# Patient Record
Sex: Female | Born: 1960 | Hispanic: Yes | Marital: Married | Smoking: Never smoker
Health system: Southern US, Community
[De-identification: ages and names within clinical notes are randomized; demographics above are authoritative.]

## PROBLEM LIST (undated history)

## (undated) DIAGNOSIS — R319 Hematuria, unspecified: Secondary | ICD-10-CM

## (undated) HISTORY — DX: Hematuria, unspecified: R31.9

---

## 2014-03-09 DIAGNOSIS — R319 Hematuria, unspecified: Secondary | ICD-10-CM | POA: Insufficient documentation

## 2014-03-09 DIAGNOSIS — R351 Nocturia: Secondary | ICD-10-CM | POA: Insufficient documentation

## 2014-03-09 DIAGNOSIS — R7303 Prediabetes: Secondary | ICD-10-CM | POA: Insufficient documentation

## 2014-03-09 DIAGNOSIS — R809 Proteinuria, unspecified: Secondary | ICD-10-CM | POA: Insufficient documentation

## 2014-12-26 ENCOUNTER — Ambulatory Visit: Payer: Self-pay | Admitting: Obstetrics and Gynecology

## 2014-12-26 ENCOUNTER — Encounter: Payer: Self-pay | Admitting: *Deleted

## 2015-07-31 ENCOUNTER — Encounter: Payer: Self-pay | Admitting: *Deleted

## 2015-08-03 ENCOUNTER — Encounter: Admission: RE | Payer: Self-pay | Source: Ambulatory Visit

## 2015-08-03 ENCOUNTER — Ambulatory Visit: Admission: RE | Admit: 2015-08-03 | Payer: Medicaid Other | Source: Ambulatory Visit | Admitting: Gastroenterology

## 2015-08-03 SURGERY — COLONOSCOPY WITH PROPOFOL
Anesthesia: General

## 2015-08-03 NOTE — H&P (Signed)
    Primary Care Physician:  No primary care provider on file. Primary Gastroenterologist:  Dr. Bluford Kaufmannh  Pre-Procedure History & Physical: HPI:  Kimberly Townsend is a 55 y.o. female is here for an colonoscopy   History reviewed. No pertinent past medical history.  Past Surgical History  Procedure Laterality Date  . Cesarean section      Prior to Admission medications   Not on File    Allergies as of 07/23/2015  . (Not on File)    History reviewed. No pertinent family history.  Social History   Social History  . Marital Status: Married    Spouse Name: N/A  . Number of Children: N/A  . Years of Education: N/A   Occupational History  . Not on file.   Social History Main Topics  . Smoking status: Never Smoker   . Smokeless tobacco: Not on file  . Alcohol Use: No  . Drug Use: No  . Sexual Activity: Not on file   Other Topics Concern  . Not on file   Social History Narrative  . No narrative on file    Review of Systems: See HPI, otherwise negative ROS  Physical Exam: There were no vitals taken for this visit. General:   Alert,  pleasant and cooperative in NAD Head:  Normocephalic and atraumatic. Neck:  Supple; no masses or thyromegaly. Lungs:  Clear throughout to auscultation.    Heart:  Regular rate and rhythm. Abdomen:  Soft, nontender and nondistended. Normal bowel sounds, without guarding, and without rebound.   Neurologic:  Alert and  oriented x4;  grossly normal neurologically.  Impression/Plan: Kimberly Townsend is here for an colonoscopy to be performed for screening Risks, benefits, limitations, and alternatives regarding  colonoscopy have been reviewed with the patient.  Questions have been answered.  All parties agreeable.   Ikaika Showers, Ezzard StandingPAUL Y, MD  08/03/2015, 11:38 AM

## 2015-08-07 ENCOUNTER — Encounter: Payer: Self-pay | Admitting: *Deleted

## 2015-08-12 ENCOUNTER — Ambulatory Visit: Payer: Medicaid Other | Admitting: Urology

## 2015-08-25 ENCOUNTER — Ambulatory Visit: Payer: Medicaid Other | Admitting: Urology

## 2015-09-04 ENCOUNTER — Ambulatory Visit: Payer: Medicaid Other | Admitting: Urology

## 2015-09-18 ENCOUNTER — Ambulatory Visit (INDEPENDENT_AMBULATORY_CARE_PROVIDER_SITE_OTHER): Payer: Medicaid Other | Admitting: Urology

## 2015-09-18 ENCOUNTER — Encounter: Payer: Self-pay | Admitting: Urology

## 2015-09-18 VITALS — BP 90/45 | HR 80 | Ht 63.0 in

## 2015-09-18 DIAGNOSIS — R3129 Other microscopic hematuria: Secondary | ICD-10-CM | POA: Diagnosis not present

## 2015-09-18 LAB — URINALYSIS, COMPLETE
Bilirubin, UA: NEGATIVE
GLUCOSE, UA: NEGATIVE
Ketones, UA: NEGATIVE
NITRITE UA: NEGATIVE
Protein, UA: NEGATIVE
SPEC GRAV UA: 1.025 (ref 1.005–1.030)
UUROB: 0.2 mg/dL (ref 0.2–1.0)
pH, UA: 5.5 (ref 5.0–7.5)

## 2015-09-18 LAB — MICROSCOPIC EXAMINATION

## 2015-09-18 NOTE — Progress Notes (Signed)
09/18/2015 10:54 AM   Kimberly Townsend 08-20-1960 [id number]  Referring provider: No referring provider defined for this encounter.  Chief Complaint  Patient presents with  . Hematuria    New Patient    HPI: Patient is a 55 -year-old [name] who presents today as a referral from their PCP, Dr. [name], for microscopic hematuria.  Patient was found to have a positive UA dip for blood on 04/29/2015.   Patient  does have a prior history of microscopic hematuria.    She does not have a prior history of recurrent urinary tract infections, nephrolithiasis, trauma to the genitourinary tract or malignancies of the genitourinary tract.   She does not have a family medical history of nephrolithiasis, malignancies of the genitourinary tract or hematuria.   Today, she is not having symptoms of frequent urination, urgency, dysuria, nocturia, incontinence, hesitancy, intermittency, straining to urinate or a weak urinary stream.  Her UA today demonstrates 11-30 RBC's/hpf.  She is not experiencing any suprapubic pain, abdominal pain or flank pain. She denies any recent fevers, chills, nausea or vomiting.   [facility].  He was found to have a punctate calculus in the left renal superior pole. 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    She is not a smoker.  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  She is not exposed to industrial chemicals.    PMH: Past Medical History  Diagnosis Date  . Hematuria     Surgical History: Past Surgical History  Procedure Laterality Date  . Cesarean section      Home Medications:    Medication List    Notice  As of 09/18/2015 10:54 AM   You have not been prescribed any medications.      Allergies: No Known Allergies  Family History: Family History  Problem Relation  Age of Onset  . Prostate cancer Neg Hx   . Kidney disease Neg Hx   . Kidney cancer Neg Hx     Social History:  reports that she has never smoked. She does not have any smokeless tobacco history on file. She reports that she does not drink alcohol or use illicit drugs.  ROS: UROLOGY Frequent Urination?: Yes Hard to postpone urination?: Yes Burning/pain with urination?: No Get up at night to urinate?: Yes Leakage of urine?: No Urine stream starts and stops?: No Trouble starting stream?: No Do you have to strain to urinate?: No Blood in urine?: No Urinary tract infection?: No Sexually transmitted disease?: No Injury to kidneys or bladder?: No Painful intercourse?: No Weak stream?: No Currently pregnant?: No Vaginal bleeding?: No Last menstrual period?: 03/2015  Gastrointestinal Nausea?: No Vomiting?: No Indigestion/heartburn?: No Diarrhea?: No Constipation?: No  Constitutional Fever: No Night sweats?: No Weight loss?: No Fatigue?: No  Skin Skin rash/lesions?: No Itching?: No  Eyes Blurred vision?: No Double vision?: No  Ears/Nose/Throat Sore throat?: No Sinus problems?: Yes  Hematologic/Lymphatic Swollen glands?: No Easy bruising?: No  Cardiovascular Leg swelling?: No Chest pain?: No  Respiratory Cough?: No Shortness of breath?: No  Endocrine Excessive thirst?: No  Musculoskeletal Back pain?: No Joint pain?: No  Neurological Headaches?: No Dizziness?: No  Psychologic Depression?: No Anxiety?: No  Physical Exam: BP 90/45 mmHg  Pulse 80  Ht 5\' 3"  (1.6 m)  LMP 04/20/2015 (Approximate)  Constitutional: Well nourished. Alert and oriented, No acute distress. HEENT: [place] AT, moist mucus membranes. Trachea midline, no masses. Cardiovascular: No clubbing, cyanosis, or edema. Respiratory: Normal respiratory effort, no increased work of breathing. GI: Abdomen is soft, non tender, non distended, no abdominal masses. Liver and spleen not  palpable.  No hernias appreciated.  Stool sample for occult testing is not indicated.   GU: No CVA tenderness.  No bladder fullness or masses.  Normal external genitalia, normal pubic hair distribution, no lesions.  Normal urethral meatus, no lesions, no prolapse, no discharge.   No urethral masses, tenderness and/or tenderness. No bladder fullness, tenderness or masses. Normal vagina mucosa, good estrogen effect, no discharge, no lesions, good pelvic support, no cystocele or rectocele noted.  No cervical motion tenderness.  Uterus is freely mobile and non-fixed.  No adnexal/parametria masses or tenderness noted.  Anus and perineum are without rashes or lesions.    Skin: No rashes, bruises or suspicious lesions. Lymph: No cervical or inguinal adenopathy. Neurologic: Grossly intact, no focal deficits, moving all 4 extremities. Psychiatric: Normal mood and affect.  Laboratory Data: Urinalysis Results for orders placed or performed in visit on 09/18/15  Microscopic Examination  Result Value Ref Range   WBC, UA >30 (A) 0 -  5 /hpf   RBC, UA 11-30 (A) 0 -  2 /hpf   Epithelial Cells (non renal) 0-10 0 - 10 /hpf   Renal Epithel, UA 0-10 (A) None seen /hpf   Bacteria, UA Few None seen/Few  Urinalysis, Complete  Result Value Ref Range   Specific Gravity, UA 1.025 1.005 - 1.030   pH, UA 5.5 5.0 - 7.5   Color, UA Yellow Yellow   Appearance Ur Cloudy (A) Clear   Leukocytes, UA 2+ (A) Negative   Protein, UA Negative Negative/Trace   Glucose, UA Negative Negative   Ketones, UA Negative Negative   RBC, UA 2+ (A) Negative   Bilirubin, UA Negative Negative   Urobilinogen, Ur 0.2 0.2 - 1.0 mg/dL   Nitrite, UA Negative Negative   Microscopic Examination See below:   BUN+Creat  Result Value Ref Range   BUN 14 6 - 24 mg/dL   Creatinine, Ser 1.610.78 0.57 - 1.00 mg/dL   GFR calc non Af Amer 85 >59 mL/min/1.73   GFR calc Af Amer 98 >59 mL/min/1.73   BUN/Creatinine Ratio 18 9 - 23   Pertinent  Imaging: CT abdomen and pelvis, without and with IV contrast, 11/13/2014  Comparison:  None.  Indication:  R31.2 Other microscopic hematuria, microhematuria.  Technique:  CT imaging was performed of the abdomen and pelvis without and with IV contrast. The patient received intravenous contrast (Isovue-300, 150 mL at 3 mL/sec) without incident.  A genitourinary protocol CT was performed, with noncontrast imaging from the kidneys through the bladder, nephrographic phase imaging through the kidneys, and excretory phase imaging from the kidneys through the bladder.   Iodinated contrast was used due to the indications for the examination, to improve disease detection and to further define anatomy. This examination was interpreted using coronal reformatted images to optimize visualization of the collecting system and ureters. The most recent serum creatinine is 0.7 mg/dL on 0/96/04548/25/2016.    Findings:  The imaged heart and lung bases are unremarkable.  Punctate calculus in the left renal superior pole (series 2 image 15) is nonspecific. No hydronephrosis. 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 The ureters are nondilated bilaterally. The left and right ureters are patent. No filling defects are seen within the urinary bladder. The urinary bladder is normal.  The liver is normal in size and contour. A rounded 1.2 cm low-attenuation lesion in the left hepatic lobe (series 3 image 46) measures fluid attenuation and is favored to represent a hepatic cyst. No biliary ductal dilatation. The hepatic and portal veins are patent. The gallbladder is normal. The spleen, pancreas, and adrenal glands are normal. The small and large bowel are normal in caliber. No free intraperitoneal air. No free fluid. No mesenteric, retroperitoneal, or pelvic sidewall lymphadenopathy.  Sclerotic changes are  seen at the pubic symphysis and the bilateral SI joints. No acute or aggressive osseous lesions.  Impression: 1. Punctate renal calculus in the left superior renal pole. No hydronephrosis. No hydroureter. No masses are identified in the collecting system. 2. 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  The preliminary report (critical or emergent communication) was reviewed prior to this dictation and there are no substantial differences between the preliminary results and the impressions in this final report.  Electronically Reviewed by:  Lyla Glassing, MD Electronically Reviewed on:  11/14/2014 10:37 AM  I have reviewed the images and concur with the above findings.  Electronically Signed by:  Manfred Shirts MD Electronically Signed on:  11/14/2014 12:36 PM   Assessment & Plan:    1. Microscopic hematuria:   Patient underwent a CT urogram in August 2016 at [facility]. No GU malignancies were discovered.   I explained to the patient that there are a number of causes that can be associated with blood in the urine, such as stones, UTI's, damage to the urinary tract and/or cancer.   I've recommended a cystoscopy to complete her hematuria work up. I described how this is performed, typically in an office setting with a flexible cystoscope. We described the risks, benefits, and possible side effects, the most common of which is a minor amount of blood in the urine and/or burning which usually resolves in 24 to 48 hours.    The patient had the opportunity to ask questions which were answered. Based upon this discussion, the patient is willing to proceed. Therefore, I've ordered: a cystoscopy.  She will return following all of the above for discussion of the results.     - Urinalysis, Complete - CULTURE, URINE COMPREHENSIVE - BUN+Creat   Return for cystoscopy .  These notes  generated with voice recognition software. I apologize for typographical errors.  Michiel Cowboy, PA-C  [facility] [address] [place], [place] [address] [phone]

## 2015-09-18 NOTE — Patient Instructions (Signed)
Cystoscopy  Cystoscopy is a procedure that is used to help your caregiver diagnose and sometimes treat conditions that affect your lower urinary tract. Your lower urinary tract includes your bladder and the tube through which urine passes from your bladder out of your body (urethra). Cystoscopy is performed with a thin, tube-shaped instrument (cystoscope). The cystoscope has lenses and a light at the end so that your caregiver can see inside your bladder. The cystoscope is inserted at the entrance of your urethra. Your caregiver guides it through your urethra and into your bladder. There are two main types of cystoscopy:  · Flexible cystoscopy (with a flexible cystoscope).  · Rigid cystoscopy (with a rigid cystoscope).  Cystoscopy may be recommended for many conditions, including:  · Urinary tract infections.  · Blood in your urine (hematuria).  · Loss of bladder control (urinary incontinence) or overactive bladder.  · Unusual cells found in a urine sample.  · Urinary blockage.  · Painful urination.  Cystoscopy may also be done to remove a sample of your tissue to be checked under a microscope (biopsy). It may also be done to remove or destroy bladder stones.  LET YOUR CAREGIVER KNOW ABOUT:  · Allergies to food or medicine.  · Medicines taken, including vitamins, herbs, eyedrops, over-the-counter medicines, and creams.  · Use of steroids (by mouth or creams).  · Previous problems with anesthetics or numbing medicines.  · History of bleeding problems or blood clots.  · Previous surgery.  · Other health problems, including diabetes and kidney problems.  · Possibility of pregnancy, if this applies.  PROCEDURE  The area around the opening to your urethra will be cleaned. A medicine to numb your urethra (local anesthetic) is used. If a tissue sample or stone is removed during the procedure, you may be given a medicine to make you sleep (general anesthetic).  Your caregiver will gently insert the tip of the cystoscope  into your urethra. The cystoscope will be slowly glided through your urethra and into your bladder. Sterile fluid will flow through the cystoscope and into your bladder. The fluid will expand and stretch your bladder. This gives your caregiver a better view of your bladder walls. The procedure lasts about 15-20 minutes.  AFTER THE PROCEDURE  If a local anesthetic is used, you will be allowed to go home as soon as you are ready. If a general anesthetic is used, you will be taken to a recovery area until you are stable. You may have temporary bleeding and burning on urination.     This information is not intended to replace advice given to you by your health care provider. Make sure you discuss any questions you have with your health care provider.     Document Released: 03/04/2000 Document Revised: 03/28/2014 Document Reviewed: 08/29/2011  Elsevier Interactive Patient Education ©2016 [employer] Inc[employer]

## 2015-09-19 LAB — BUN+CREAT
BUN / CREAT RATIO: 18 (ref 9–23)
BUN: 14 mg/dL (ref 6–24)
CREATININE: 0.78 mg/dL (ref 0.57–1.00)
GFR calc Af Amer: 98 mL/min/{1.73_m2} (ref >59)
GFR, EST NON AFRICAN AMERICAN: 85 mL/min/{1.73_m2} (ref >59)

## 2015-09-20 ENCOUNTER — Telehealth: Payer: Self-pay | Admitting: Urology

## 2015-09-20 NOTE — Telephone Encounter (Signed)
Would you please send a copy of this office visit to the patient's referring provider, Dr. Olmedo?     

## 2015-09-21 ENCOUNTER — Telehealth: Payer: Self-pay

## 2015-09-21 DIAGNOSIS — N390 Urinary tract infection, site not specified: Secondary | ICD-10-CM

## 2015-09-21 MED ORDER — AMOXICILLIN-POT CLAVULANATE 875-125 MG PO TABS: 1.0000 | ORAL_TABLET | Freq: Two times a day (BID) | ORAL | Status: AC

## 2015-09-21 NOTE — Telephone Encounter (Signed)
-----   Message from Harle BattiestShannon A McGowan, PA-C sent at 09/21/2015  9:09 AM EDT ----- Please start the patient on Augmentin 875/125 mg, one tablet twice daily until her cystoscopy appointment.

## 2015-09-21 NOTE — Telephone Encounter (Signed)
Spoke with pt in reference to +ucx. Made aware abx were sent to pharmacy and will need to be taken until cysto. Pt voiced understanding.

## 2015-09-22 LAB — CULTURE, URINE COMPREHENSIVE

## 2015-09-23 ENCOUNTER — Other Ambulatory Visit: Payer: Self-pay | Admitting: Urology

## 2015-09-23 ENCOUNTER — Encounter: Payer: Self-pay | Admitting: Urology

## 2015-09-23 MED ORDER — CIPROFLOXACIN HCL 500 MG PO TABS: 500.0000 mg | ORAL_TABLET | Freq: Two times a day (BID) | ORAL | Status: DC

## 2015-09-25 ENCOUNTER — Telehealth: Payer: Self-pay

## 2015-09-25 NOTE — Telephone Encounter (Signed)
-----   Message from Harle BattiestShannon A McGowan, PA-C sent at 09/24/2015  1:42 PM EDT ----- I spoke with Dr. 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

## 2015-09-25 NOTE — Telephone Encounter (Signed)
-----   Message from Harle BattiestShannon A McGowan, PA-C sent at 09/23/2015  8:19 AM EDT ----- Patient is advised of her culture results.  I have asked her to take the Cipro and the Augmentin.  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  I have advised her to not work with the woman until she is cleared of VRE.

## 2015-09-25 NOTE — Telephone Encounter (Signed)
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 Pt voiced understanding.

## 2015-09-29 ENCOUNTER — Telehealth: Payer: Self-pay | Admitting: Urology

## 2015-09-29 NOTE — Telephone Encounter (Signed)
pcp received your notes

## 2015-09-29 NOTE — Telephone Encounter (Signed)
pcp has received your notes   Kimberly DusterMichelle

## 2015-11-06 ENCOUNTER — Telehealth: Payer: Self-pay

## 2015-11-06 ENCOUNTER — Other Ambulatory Visit: Payer: Medicaid Other | Admitting: Urology

## 2015-11-06 NOTE — Telephone Encounter (Signed)
Pt called stating Carollee HerterShannon gave her an abx to take for 7 days and then she received a second abx. Pt wanted to know which medication she was supposed to be taking. Reinforced with pt as Carollee HerterShannon previously advised take augmentin until cysto appt. Pt voiced understanding.

## 2015-11-17 ENCOUNTER — Encounter: Admission: RE | Payer: Self-pay | Source: Ambulatory Visit

## 2015-11-17 ENCOUNTER — Ambulatory Visit: Admission: RE | Admit: 2015-11-17 | Payer: Medicaid Other | Source: Ambulatory Visit | Admitting: Gastroenterology

## 2015-11-17 SURGERY — COLONOSCOPY WITH PROPOFOL
Anesthesia: General

## 2015-12-02 ENCOUNTER — Other Ambulatory Visit: Payer: Medicaid Other | Admitting: Urology

## 2015-12-02 ENCOUNTER — Telehealth: Payer: Self-pay | Admitting: Urology

## 2015-12-02 NOTE — Progress Notes (Deleted)
12/02/2015 10:10 AM   Kimberly Townsend 1960/08/04 [id number]  Referring provider: Dione HousekeeperMario Ernesto Olmedo, MD [address] [address] [place], [place] [id number]  No chief complaint on file.   HPI:  55 -year-old African-American with a history of microscopic hematuria who returns to the office today for cystoscopy to complete her microscopic hematuria workup.  She [facility]13[facility].  He was found to have a punctate calculus in the left renal superior pole. 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    She is not a smoker.  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  She is not exposed to industrial chemicals.    PMH: Past Medical History:  Diagnosis Date  . Hematuria     Surgical History: Past Surgical History:  Procedure Laterality Date  . CESAREAN SECTION      Home Medications:    Medication List       Accurate as of 12/02/15 10:10 AM. Always use your most recent med list.          ciprofloxacin 500 MG tablet Commonly known as:  CIPRO Take 1 tablet (500 mg total) by mouth every 12 (twelve) hours.       Allergies: No Known Allergies  Family History: Family History  Problem Relation Age of Onset  . Prostate cancer Neg Hx   . Kidney disease Neg Hx   . Kidney cancer Neg Hx     Social History:  reports that she has never smoked. She does not have any smokeless tobacco history on file. She reports that she does not drink alcohol or use drugs.   Physical Exam: There were no vitals taken for this visit.  Constitutional: Well nourished. Alert and oriented, No acute distress. HEENT: [place] AT, moist mucus membranes. Trachea midline, no masses. Cardiovascular: No clubbing, cyanosis, or edema. Respiratory: Normal respiratory effort, no increased work of breathing. GI: Abdomen is soft, non tender, non distended, no abdominal  masses.  GU: Normal external genitalia. Skin: No rashes, bruises or suspicious lesions. Lymph: No cervical or inguinal adenopathy. Neurologic: Grossly intact, no focal deficits, moving all 4 extremities. Psychiatric: Normal mood and affect.  Laboratory Data:  Pertinent Imaging: CT abdomen and pelvis, without and with IV contrast, 11/13/2014  Comparison:  None.  Indication:  R31.2 Other microscopic hematuria, microhematuria.  Technique:  CT imaging was performed of the abdomen and pelvis without and with IV contrast. The patient received intravenous contrast (Isovue-300, 150 mL at 3 mL/sec) without incident.  A genitourinary protocol CT was performed, with noncontrast imaging from the kidneys through the bladder, nephrographic phase imaging through the kidneys, and excretory phase imaging from the kidneys through the bladder.   Iodinated contrast was used due to the indications for the examination, to improve disease detection and to further define anatomy. This examination was interpreted using coronal reformatted images to optimize visualization of the collecting system and ureters. The most recent serum creatinine is 0.7 mg/dL on 5/63/87568/25/2016.    Findings:  The imaged heart and lung bases are unremarkable.  Punctate calculus in the left renal superior pole (series 2 image 15) is nonspecific. No hydronephrosis. 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 The ureters are nondilated bilaterally. The left and right  ureters are patent. No filling defects are seen within the urinary bladder. The urinary bladder is normal.  The liver is normal in size and contour. A rounded 1.2 cm low-attenuation lesion in the left hepatic lobe (series 3 image 46) measures fluid attenuation and is favored to represent a hepatic cyst. No biliary ductal dilatation. The hepatic and portal veins are  patent. The gallbladder is normal. The spleen, pancreas, and adrenal glands are normal. The small and large bowel are normal in caliber. No free intraperitoneal air. No free fluid. No mesenteric, retroperitoneal, or pelvic sidewall lymphadenopathy.  Sclerotic changes are seen at the pubic symphysis and the bilateral SI joints. No acute or aggressive osseous lesions.  Impression: 1. Punctate renal calculus in the left superior renal pole. No hydronephrosis. No hydroureter. No masses are identified in the collecting system. 2. 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  The preliminary report (critical or emergent communication) was reviewed prior to this dictation and there are no substantial differences between the preliminary results and the impressions in this final report.  Electronically Reviewed by:  Lyla Glassing, MD Electronically Reviewed on:  11/14/2014 10:37 AM  I have reviewed the images and concur with the above findings.  Electronically Signed by:  Manfred Shirts MD Electronically Signed on:  11/14/2014 12:36 PM   Cystoscopy Procedure Note  Patient identification was confirmed, informed consent was obtained, and patient was prepped using Betadine solution.  Lidocaine jelly was administered per urethral meatus.    Preoperative abx where received prior to procedure.    Procedure: - Flexible cystoscope introduced, without any difficulty.   - Thorough search of the bladder revealed:    normal urethral meatus    normal urothelium    no stones    no ulcers     no tumors    no urethral polyps    no trabeculation  - Ureteral orifices were normal in position and appearance.  Post-Procedure: - Patient tolerated the procedure well  Assessment & Plan:    1. Microscopic hematuria:     No Follow-up on file.  These notes generated with voice recognition  software. I apologize for typographical errors.  [name], MD  [facility] [address] [place], [place] [address] [phone]

## 2015-12-02 NOTE — Telephone Encounter (Signed)
Patient no showed for her cysto today, I called to resched and spoke with a family member. I left a message for her to call back so I can reschd the cysto.  Kimberly DusterMichelle

## 2016-01-22 ENCOUNTER — Ambulatory Visit (INDEPENDENT_AMBULATORY_CARE_PROVIDER_SITE_OTHER): Payer: Medicaid Other | Admitting: Urology

## 2016-01-22 ENCOUNTER — Encounter: Payer: Self-pay | Admitting: Urology

## 2016-01-22 VITALS — BP 158/97 | HR 80 | Ht 63.0 in

## 2016-01-22 DIAGNOSIS — R3129 Other microscopic hematuria: Secondary | ICD-10-CM

## 2016-01-22 LAB — URINALYSIS, COMPLETE
BILIRUBIN UA: NEGATIVE
Glucose, UA: NEGATIVE
KETONES UA: NEGATIVE
Leukocytes, UA: NEGATIVE
NITRITE UA: NEGATIVE
Protein, UA: NEGATIVE
SPEC GRAV UA: 1.02 (ref 1.005–1.030)
Urobilinogen, Ur: 0.2 mg/dL (ref 0.2–1.0)
pH, UA: 7 (ref 5.0–7.5)

## 2016-01-22 LAB — MICROSCOPIC EXAMINATION
Bacteria, UA: NONE SEEN
WBC UA: NONE SEEN /HPF (ref 0–?)

## 2016-01-22 MED ORDER — CIPROFLOXACIN HCL 500 MG PO TABS
500.0000 mg | ORAL_TABLET | Freq: Once | ORAL | Status: AC
  Administered 2016-01-22: 500 mg via ORAL

## 2016-01-22 MED ORDER — LIDOCAINE HCL 2 % EX GEL
1.0000 "application " | Freq: Once | CUTANEOUS | Status: AC
  Administered 2016-01-22: 1 via URETHRAL

## 2016-01-22 NOTE — Progress Notes (Signed)
   01/22/16  CC:  Chief Complaint  Patient presents with  . Cysto    HPI: 55 year old female with microscopic hematuria who presents for cystoscopy to complete her workup.  She [facility].  She was found to have a punctate calculus in the left renal superior pole. 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    She is not a smoker.  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  She is not exposed to industrial chemicals.   No flank pain and history of gross hematuria.  Blood pressure (!) 158/97, pulse 80, height 5\' 3"  (1.6 m). NED. A&Ox3.   No respiratory distress   Abd soft, NT, ND Normal external genitalia with patent urethral meatus  UA today reviewed, shows microscopic hematuria appreciated otherwise negative.  Cystoscopy Procedure Note  Patient identification was confirmed, informed consent was obtained, and patient was prepped using Betadine solution.  Lidocaine jelly was administered per urethral meatus.    Preoperative abx where received prior to procedure.    Procedure: - Flexible cystoscope introduced, without any difficulty.   - Thorough search of the bladder revealed:    normal urethral meatus    normal urothelium    no stones    no ulcers     no tumors    no urethral polyps    no trabeculation  - Ureteral orifices were normal in position and appearance.  Post-Procedure: - Patient tolerated the procedure well  Assessment/ Plan  1. Microscopic hematuria Cystoscopy negativetoday. Findings were reviewed with the patient. CT urogram also unremarkable. F/u as needed- she does have some urinary complaints today and have advised her to follow-up with Michiel CowboyShannon McGowan to discuss this further as - Urinalysis, Complete - ciprofloxacin (CIPRO) tablet 500 mg; Take 1 tablet (500 mg total) by mouth once. -  lidocaine (XYLOCAINE) 2 % jelly 1 application; Place 1 application into the urethra once.   Vanna ScotlandAshley Zaahir Pickney, MD

## 2016-02-01 ENCOUNTER — Encounter: Payer: Self-pay | Admitting: Urology

## 2016-02-01 ENCOUNTER — Ambulatory Visit: Payer: Medicaid Other | Admitting: Urology

## 2017-03-03 NOTE — Progress Notes (Deleted)
03/06/2017 10:58 AM   Clarita Leberegina Senft 1960-04-17 [id number]  Referring provider: [name], Mario Ernesto, MD [address] [place], [place] [address]  No chief complaint on file.   HPI: 56 yo AAF with a history of microscopic hematuria who presents today for a yearly follow up.  Background history Patient is a 56 -year-old [name] who presents today as a referral from their PCP, Dr. [name], for microscopic hematuria.  Patient was found to have a positive UA dip for blood on 04/29/2015.   Patient  does have a prior history of microscopic hematuria.   She does not have a prior history of recurrent urinary tract infections, nephrolithiasis, trauma to the genitourinary tract or malignancies of the genitourinary tract.  She does not have a family medical history of nephrolithiasis, malignancies of the genitourinary tract or hematuria.   Her UA demonstratesd11-30 RBC's/hpf.  [facility].  She was found to have a punctate calculus in the left renal superior pole. 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  She is not a smoker.  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  She is not exposed to industrial chemicals.   Cystoscopy performed on 01/22/2016 with Dr. [name] was negative.   Today, she is experiencing urgency x *** (***), frequency x *** (***), not/is restricting fluids to avoid visits to the restroom ***, not/is engaging in toilet mapping, incontinence x *** (***) and nocturia x *** (***).   Her PVR is ***.  Her UA is ***.   She is not having fevers, chills, nausea or vomiting.  She is not having dysuria, gross hematuria or suprapubic pain.    PMH: Past Medical History:  Diagnosis Date  . Hematuria     Surgical History: Past Surgical History:  Procedure Laterality Date  . CESAREAN SECTION      Home  Medications:  Allergies as of 03/06/2017   No Known Allergies     Medication List    as of 03/03/2017 10:58 AM   You have not been prescribed any medications.     Allergies: No Known Allergies  Family History: Family History  Problem Relation Age of Onset  . Prostate cancer Neg Hx   . Kidney disease Neg Hx   . Kidney cancer Neg Hx     Social History:  reports that  has never smoked. she has never used smokeless tobacco. She reports that she does not drink alcohol or use drugs.  ROS:                                        Physical Exam: There were no vitals taken for this visit.  Constitutional: Well nourished. Alert and oriented, No acute distress. HEENT: [place] AT, moist mucus membranes. Trachea midline, no masses. Cardiovascular: No clubbing, cyanosis, or edema. Respiratory: Normal respiratory effort, no increased work of breathing. GI: Abdomen is soft, non tender, non distended, no abdominal masses. Liver and spleen not palpable.  No hernias appreciated.  Stool sample for occult testing is not indicated.   GU: No CVA tenderness.  No bladder fullness or masses.  Normal external genitalia, normal pubic hair distribution, no lesions.  Normal urethral meatus, no lesions, no prolapse, no discharge.  No urethral masses, tenderness and/or tenderness. No bladder fullness, tenderness or masses. Normal vagina mucosa, good estrogen effect, no discharge, no lesions, good pelvic support, no cystocele or rectocele noted.  No cervical motion tenderness.  Uterus is freely mobile and non-fixed.  No adnexal/parametria masses or tenderness noted.  Anus and perineum are without rashes or lesions.   *** Skin: No rashes, bruises or suspicious lesions. Lymph: No cervical or inguinal adenopathy. Neurologic: Grossly intact, no focal deficits, moving all 4 extremities. Psychiatric: Normal mood and affect.  Laboratory Data: Urinalysis Results for orders placed or performed  in visit on 01/22/16  Microscopic Examination  Result Value Ref Range   WBC, UA None seen 0 - 5 /hpf   RBC, UA 3-10 (A) 0 - 2 /hpf   Epithelial Cells (non renal) 0-10 0 - 10 /hpf   Bacteria, UA None seen None seen/Few  Urinalysis, Complete  Result Value Ref Range   Specific Gravity, UA 1.020 1.005 - 1.030   pH, UA 7.0 5.0 - 7.5   Color, UA Yellow Yellow   Appearance Ur Clear Clear   Leukocytes, UA Negative Negative   Protein, UA Negative Negative/Trace   Glucose, UA Negative Negative   Ketones, UA Negative Negative   RBC, UA 1+ (A) Negative   Bilirubin, UA Negative Negative   Urobilinogen, Ur 0.2 0.2 - 1.0 mg/dL   Nitrite, UA Negative Negative   Microscopic Examination See below:    Pertinent Imaging: CT abdomen and pelvis, without and with IV contrast, 11/13/2014  Comparison:  None.  Indication:  R31.2 Other microscopic hematuria, microhematuria.  Technique:  CT imaging was performed of the abdomen and pelvis without and with IV contrast. The patient received intravenous contrast (Isovue-300, 150 mL at 3 mL/sec) without incident.  A genitourinary protocol CT was performed, with noncontrast imaging from the kidneys through the bladder, nephrographic phase imaging through the kidneys, and excretory phase imaging from the kidneys through the bladder.   Iodinated contrast was used due to the indications for the examination, to improve disease detection and to further define anatomy. This examination was interpreted using coronal reformatted images to optimize visualization of the collecting system and ureters. The most recent serum creatinine is 0.7 mg/dL on 1/19/14788/25/2016.    Findings:  The imaged heart and lung bases are unremarkable.  Punctate calculus in the left renal superior pole (series 2 image 15) is nonspecific. No hydronephrosis. 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 The ureters are nondilated bilaterally. The left and right ureters are patent. No filling defects are seen within the urinary bladder. The urinary bladder is normal.  The liver is normal in size and contour. A rounded 1.2 cm low-attenuation lesion in the left hepatic lobe (series 3 image 46) measures fluid attenuation and is favored to represent a hepatic cyst. No biliary ductal dilatation. The hepatic and portal veins are patent. The gallbladder is normal. The spleen, pancreas, and adrenal glands are normal. The small and large bowel are normal in caliber. No free intraperitoneal air. No free fluid. No mesenteric, retroperitoneal, or pelvic sidewall lymphadenopathy.  Sclerotic changes are seen at the pubic symphysis and the bilateral SI joints. No acute or aggressive osseous lesions.  Impression: 1. Punctate renal calculus in the left superior renal pole. No hydronephrosis. No hydroureter. No masses are identified in the collecting system. 2. Incidental note of a partially duplicated left collecting  system with convergence of the superior and inferior pole ureters just proximal to the left UVJ.  The preliminary report (critical or emergent communication) was reviewed prior to this dictation and there are no substantial differences between the preliminary results and the impressions in this final report.  Electronically Reviewed by:  Lyla GlassingJohn Latting, MD Electronically Reviewed on:  11/14/2014 10:37 AM  I have reviewed the images and concur with the above findings.  Electronically Signed by:  Manfred Shirtsendon Nelson MD Electronically Signed on:  11/14/2014 12:36 PM   Assessment & Plan:    1. History of hematuria  - completed hematuria work up in 2017 - no worrisome findings  - no reports of gross hematuria  - UA was negative  2. Left punctate renal stone  - ***   No Follow-up on file.  These notes generated with voice recognition software. I apologize for typographical  errors.  Michiel CowboySHANNON Demontez Novack, PA-C  [facility] [address], [address] [place], [place] [address] [phone]

## 2017-03-06 ENCOUNTER — Ambulatory Visit: Payer: Self-pay | Admitting: Urology

## 2017-03-08 NOTE — Progress Notes (Deleted)
03/09/2017 9:35 AM   Kimberly Townsend 29-Aug-1960 [id number]  Referring provider: Dione Townsend, Kimberly Ernesto, MD [address] [place], [place] [address]  No chief complaint on file.   HPI: 56 yo AAF with a history of microscopic hematuria who presents today for a yearly follow up.  Background history Patient is a 56 -year-old [name] who presents today as a referral from their PCP, Dr. Zada Townsend, for microscopic hematuria.  Patient was found to have a positive UA dip for blood on 04/29/2015.   Patient  does have a prior history of microscopic hematuria.   She does not have a prior history of recurrent urinary tract infections, nephrolithiasis, trauma to the genitourinary tract or malignancies of the genitourinary tract.  She does not have a family medical history of nephrolithiasis, malignancies of the genitourinary tract or hematuria.   Her UA demonstratesd11-30 RBC's/hpf.  [facility].  She was found to have a punctate calculus in the left renal superior pole. 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  She is not a smoker.  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  She is not exposed to industrial chemicals.   Cystoscopy performed on 01/22/2016 with Dr. [name] was negative.   Today, she is experiencing urgency x *** (***), frequency x *** (***), not/is restricting fluids to avoid visits to the restroom ***, not/is engaging in toilet mapping, incontinence x *** (***) and nocturia x *** (***).   Her PVR is ***.  Her UA is ***.   She is not having fevers, chills, nausea or vomiting.  She is not having dysuria, gross hematuria or suprapubic pain.    PMH: Past Medical History:  Diagnosis Date  . Hematuria     Surgical History: Past Surgical History:  Procedure Laterality Date  . CESAREAN SECTION      Home  Medications:  Allergies as of 03/09/2017   No Known Allergies     Medication List    as of 03/08/2017  9:35 AM   You have not been prescribed any medications.     Allergies: No Known Allergies  Family History: Family History  Problem Relation Age of Onset  . Prostate cancer Neg Hx   . Kidney disease Neg Hx   . Kidney cancer Neg Hx     Social History:  reports that  has never smoked. she has never used smokeless tobacco. She reports that she does not drink alcohol or use drugs.  ROS:                                        Physical Exam: There were no vitals taken for this visit.  Constitutional: Well nourished. Alert and oriented, No acute distress. HEENT:  AT, moist mucus membranes. Trachea midline, no masses. Cardiovascular: No clubbing, cyanosis, or edema. Respiratory: Normal respiratory effort, no increased work of breathing. GI: Abdomen is soft, non tender, non distended, no abdominal masses. Liver and spleen not palpable.  No hernias appreciated.  Stool sample for occult testing is not indicated.   GU: No CVA tenderness.  No bladder fullness or masses.  Normal external genitalia, normal pubic hair distribution, no lesions.  Normal urethral meatus, no lesions, no prolapse, no discharge.  No urethral masses, tenderness and/or tenderness. No bladder fullness, tenderness or masses. Normal vagina mucosa, good estrogen effect, no discharge, no lesions, good pelvic support, no cystocele or rectocele noted.  No cervical motion tenderness.  Uterus is freely mobile and non-fixed.  No adnexal/parametria masses or tenderness noted.  Anus and perineum are without rashes or lesions.   *** Skin: No rashes, bruises or suspicious lesions. Lymph: No cervical or inguinal adenopathy. Neurologic: Grossly intact, no focal deficits, moving all 4 extremities. Psychiatric: Normal mood and affect.  Laboratory Data: Urinalysis Results for orders placed or performed  in visit on 01/22/16  Microscopic Examination  Result Value Ref Range   WBC, UA None seen 0 - 5 /hpf   RBC, UA 3-10 (A) 0 - 2 /hpf   Epithelial Cells (non renal) 0-10 0 - 10 /hpf   Bacteria, UA None seen None seen/Few  Urinalysis, Complete  Result Value Ref Range   Specific Gravity, UA 1.020 1.005 - 1.030   pH, UA 7.0 5.0 - 7.5   Color, UA Yellow Yellow   Appearance Ur Clear Clear   Leukocytes, UA Negative Negative   Protein, UA Negative Negative/Trace   Glucose, UA Negative Negative   Ketones, UA Negative Negative   RBC, UA 1+ (A) Negative   Bilirubin, UA Negative Negative   Urobilinogen, Ur 0.2 0.2 - 1.0 mg/dL   Nitrite, UA Negative Negative   Microscopic Examination See below:    Pertinent Imaging: CT abdomen and pelvis, without and with IV contrast, 11/13/2014  Comparison:  None.  Indication:  R31.2 Other microscopic hematuria, microhematuria.  Technique:  CT imaging was performed of the abdomen and pelvis without and with IV contrast. The patient received intravenous contrast (Isovue-300, 150 mL at 3 mL/sec) without incident.  A genitourinary protocol CT was performed, with noncontrast imaging from the kidneys through the bladder, nephrographic phase imaging through the kidneys, and excretory phase imaging from the kidneys through the bladder.   Iodinated contrast was used due to the indications for the examination, to improve disease detection and to further define anatomy. This examination was interpreted using coronal reformatted images to optimize visualization of the collecting system and ureters. The most recent serum creatinine is 0.7 mg/dL on 4/78/29568/25/2016.    Findings:  The imaged heart and lung bases are unremarkable.  Punctate calculus in the left renal superior pole (series 2 image 15) is nonspecific. No hydronephrosis. 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 The ureters are nondilated bilaterally. The left and right ureters are patent. No filling defects are seen within the urinary bladder. The urinary bladder is normal.  The liver is normal in size and contour. A rounded 1.2 cm low-attenuation lesion in the left hepatic lobe (series 3 image 46) measures fluid attenuation and is favored to represent a hepatic cyst. No biliary ductal dilatation. The hepatic and portal veins are patent. The gallbladder is normal. The spleen, pancreas, and adrenal glands are normal. The small and large bowel are normal in caliber. No free intraperitoneal air. No free fluid. No mesenteric, retroperitoneal, or pelvic sidewall lymphadenopathy.  Sclerotic changes are seen at the pubic symphysis and the bilateral SI joints. No acute or aggressive osseous lesions.  Impression: 1. Punctate renal calculus in the left superior renal pole. No hydronephrosis. No hydroureter. No masses are identified in the collecting system. 2. Incidental note of a partially duplicated left collecting  system with convergence of the superior and inferior pole ureters just proximal to the left UVJ.  The preliminary report (critical or emergent communication) was reviewed prior to this dictation and there are no substantial differences between the preliminary results and the impressions in this final report.  Electronically Reviewed by:  Lyla GlassingJohn Latting, MD Electronically Reviewed on:  11/14/2014 10:37 AM  I have reviewed the images and concur with the above findings.  Electronically Signed by:  Manfred Shirtsendon Nelson MD Electronically Signed on:  11/14/2014 12:36 PM   Assessment & Plan:    1. History of hematuria  - completed hematuria work up in 2017 - no worrisome findings  - no reports of gross hematuria  - UA was negative  2. Left punctate renal stone  - ***   No Follow-up on file.  These notes generated with voice recognition software. I apologize for typographical  errors.  Michiel CowboySHANNON Shyheim Tanney, PA-C  [facility] [address], [address] [place], [place] [address] [phone]

## 2017-03-09 ENCOUNTER — Ambulatory Visit: Payer: Self-pay | Admitting: Urology

## 2017-03-16 ENCOUNTER — Ambulatory Visit: Payer: Self-pay | Admitting: Urology

## 2017-04-02 NOTE — Progress Notes (Unsigned)
04/03/2017 7:32 PM   Kimberly Townsend [birth year] [id number]  Referring provider: Dione Housekeeper, MD [address] [place], [place] [address]  No chief complaint on file.   HPI: 57 yo AAF with a history of microscopic hematuria who presents today for a yearly follow up.  Background history Patient is a 38 -year-old [name] who presents today as a referral from their PCP, Dr. Zada Finders, for microscopic hematuria.  Patient was found to have a positive UA dip for blood on 04/29/2015.   Patient  does have a prior history of microscopic hematuria.   She does not have a prior history of recurrent urinary tract infections, nephrolithiasis, trauma to the genitourinary tract or malignancies of the genitourinary tract.  She does not have a family medical history of nephrolithiasis, malignancies of the genitourinary tract or hematuria.   Her UA demonstratesd11-30 RBC's/hpf.  She underwent a CT urogram at [facility] on 11/13/2014.  She was found to have a punctate calculus in the left renal superior pole. 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  She is not a smoker.  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  She is not exposed to industrial chemicals.   Cystoscopy performed on 01/22/2016 with Dr. Apolinar Junes was negative.   Today, she is experiencing urgency x *** (***), frequency x *** (***), not/is restricting fluids to avoid visits to the restroom ***, not/is engaging in toilet mapping, incontinence x *** (***) and nocturia x *** (***).   Her PVR is ***.  Her UA is ***.   She is not having fevers, chills, nausea or vomiting.  She is not having dysuria, gross hematuria or suprapubic pain.    PMH: Past Medical History:  Diagnosis Date  . Hematuria     Surgical History: Past Surgical History:  Procedure Laterality Date  . CESAREAN SECTION      Home  Medications:  Allergies as of 04/03/2017   No Known Allergies     Medication List    as of 04/02/2017  7:32 PM   You have not been prescribed any medications.     Allergies: No Known Allergies  Family History: Family History  Problem Relation Age of Onset  . Prostate cancer Neg Hx   . Kidney disease Neg Hx   . Kidney cancer Neg Hx     Social History:  reports that  has never smoked. she has never used smokeless tobacco. She reports that she does not drink alcohol or use drugs.  ROS:                                        Physical Exam: There were no vitals taken for this visit.  Constitutional: Well nourished. Alert and oriented, No acute distress. HEENT: [place] AT, moist mucus membranes. Trachea midline, no masses. Cardiovascular: No clubbing, cyanosis, or edema. Respiratory: Normal respiratory effort, no increased work of breathing. GI: Abdomen is soft, non tender, non distended, no abdominal masses. Liver and spleen not palpable.  No hernias appreciated.  Stool sample for occult testing is not indicated.   GU: No CVA tenderness.  No bladder fullness or masses.  Normal external genitalia, normal pubic hair distribution, no lesions.  Normal urethral meatus, no lesions, no prolapse, no discharge.  No urethral masses, tenderness and/or tenderness. No bladder fullness, tenderness or masses. Normal vagina mucosa, good estrogen effect, no discharge, no lesions, good pelvic support, no cystocele or rectocele noted.  No cervical motion tenderness.  Uterus is freely mobile and non-fixed.  No adnexal/parametria masses or tenderness noted.  Anus and perineum are without rashes or lesions.   *** Skin: No rashes, bruises or suspicious lesions. Lymph: No cervical or inguinal adenopathy. Neurologic: Grossly intact, no focal deficits, moving all 4 extremities. Psychiatric: Normal mood and affect.  Laboratory Data: Urinalysis Results for orders placed or performed in  visit on 01/22/16  Microscopic Examination  Result Value Ref Range   WBC, UA None seen 0 - 5 /hpf   RBC, UA 3-10 (A) 0 - 2 /hpf   Epithelial Cells (non renal) 0-10 0 - 10 /hpf   Bacteria, UA None seen None seen/Few  Urinalysis, Complete  Result Value Ref Range   Specific Gravity, UA 1.020 1.005 - 1.030   pH, UA 7.0 5.0 - 7.5   Color, UA Yellow Yellow   Appearance Ur Clear Clear   Leukocytes, UA Negative Negative   Protein, UA Negative Negative/Trace   Glucose, UA Negative Negative   Ketones, UA Negative Negative   RBC, UA 1+ (A) Negative   Bilirubin, UA Negative Negative   Urobilinogen, Ur 0.2 0.2 - 1.0 mg/dL   Nitrite, UA Negative Negative   Microscopic Examination See below:    Pertinent Imaging: CT abdomen and pelvis, without and with IV contrast, 11/13/2014  Comparison:  None.  Indication:  R31.2 Other microscopic hematuria, microhematuria.  Technique:  CT imaging was performed of the abdomen and pelvis without and with IV contrast. The patient received intravenous contrast (Isovue-300, 150 mL at 3 mL/sec) without incident.  A genitourinary protocol CT was performed, with noncontrast imaging from the kidneys through the bladder, nephrographic phase imaging through the kidneys, and excretory phase imaging from the kidneys through the bladder.   Iodinated contrast was used due to the indications for the examination, to improve disease detection and to further define anatomy. This examination was interpreted using coronal reformatted images to optimize visualization of the collecting system and ureters. The most recent serum creatinine is 0.7 mg/dL on 1/61/0960.    Findings:  The imaged heart and lung bases are unremarkable.  Punctate calculus in the left renal superior pole (series 2 image 15) is nonspecific. No hydronephrosis. 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 The ureters are nondilated bilaterally. The left and right ureters are patent. No filling defects are seen within the urinary bladder. The urinary bladder is normal.  The liver is normal in size and contour. A rounded 1.2 cm low-attenuation lesion in the left hepatic lobe (series 3 image 46) measures fluid attenuation and is favored to represent a hepatic cyst. No biliary ductal dilatation. The hepatic and portal veins are patent. The gallbladder is normal. The spleen, pancreas, and adrenal glands are normal. The small and large bowel are normal in caliber. No free intraperitoneal air. No free fluid. No mesenteric, retroperitoneal, or pelvic sidewall lymphadenopathy.  Sclerotic changes are seen at the pubic symphysis and the bilateral SI joints. No acute or aggressive osseous lesions.  Impression: 1. Punctate renal calculus in the left superior renal pole. No hydronephrosis. No hydroureter. No masses are identified in the collecting system. 2. Incidental note of a partially duplicated left collecting  system with convergence of the superior and inferior pole ureters just proximal to the left UVJ.  The preliminary report (critical or emergent communication) was reviewed prior to this dictation and there are no substantial differences between the preliminary results and the impressions in this final report.  Electronically Reviewed by:  Lyla GlassingJohn Latting, MD Electronically Reviewed on:  11/14/2014 10:37 AM  I have reviewed the images and concur with the above findings.  Electronically Signed by:  Manfred Shirtsendon Nelson MD Electronically Signed on:  11/14/2014 12:36 PM   Assessment & Plan:    1. History of hematuria  - completed hematuria work up in 2017 - no worrisome findings  - no reports of gross hematuria  - UA was negative  2. Left punctate renal stone  - ***   No Follow-up on file.  These notes generated with voice recognition software. I apologize for typographical  errors.  Michiel CowboySHANNON Delta Pichon, PA-C  [facility] [address], [address] [place], [place] [address] [phone]

## 2017-04-03 ENCOUNTER — Encounter: Payer: Self-pay | Admitting: Urology

## 2017-04-03 ENCOUNTER — Ambulatory Visit: Payer: Medicaid Other | Admitting: Urology

## 2017-04-03 NOTE — Progress Notes (Signed)
04/04/2017 4:50 PM   Kimberly Townsend 12-Sep-1960 [id number]  Referring provider: Dione Housekeeper, MD [address] [place], [place] [address]  Chief Complaint  Patient presents with  . Recurrent UTI    HPI: 57 yo AAF with a history of microscopic hematuria who presents today for a yearly follow up.  Background history Patient is a 69 -year-old [name] who presents today as a referral from their PCP, Dr. Zada Townsend, for microscopic hematuria.  Patient was found to have a positive UA dip for blood on 04/29/2015.   Patient  does have a prior history of microscopic hematuria.   She does not have a prior history of recurrent urinary tract infections, nephrolithiasis, trauma to the genitourinary tract or malignancies of the genitourinary tract.  She does not have a family medical history of nephrolithiasis, malignancies of the genitourinary tract or hematuria.   Her UA demonstrated 11-30 RBC's/hpf.  She underwent a CT urogram at [facility] on 11/13/2014.  She was found to have a punctate calculus in the left renal superior pole. 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  She is not a smoker.  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  She is not exposed to industrial chemicals.   Cystoscopy performed on 01/22/2016 with Dr. Apolinar Townsend was negative.   Today, she is experiencing urgency x 0-3, frequency x 0-3, not restricting fluids to avoid visits to the restroom, is engaging in toilet mapping, incontinence x 0-3 and nocturia x 0-3.   Her PVR is 35 mL.  Her UA is positive for 11-30 WBC's, many bacteria and nitrite positive.  She is not having fevers, chills, nausea or vomiting.  She is not having dysuria, gross hematuria or suprapubic pain.    She states she is getting up every two hours during the night.  She has to use the rest room three to four times a day.    She is not having gross hematuria, suprapubic pain or dysuria.  She has not had any fevers, chills, nausea or vomiting.   She states she drinks a lot of water daily and through the night.    PMH: Past Medical History:  Diagnosis Date  . Hematuria     Surgical History: Past Surgical History:  Procedure Laterality Date  . CESAREAN SECTION      Home Medications:  Allergies as of 04/04/2017   No Known Allergies     Medication List        Accurate as of 04/04/17  4:50 PM. Always use your most recent med list.          oxybutynin 10 MG 24 hr tablet Commonly known as:  DITROPAN-XL Take 1 tablet (10 mg total) by mouth daily.       Allergies: No Known Allergies  Family History: Family History  Problem Relation Age of Onset  . Prostate cancer Neg Hx   . Kidney disease Neg Hx   . Kidney cancer Neg Hx     Social History:  reports that  has never smoked. she has never used smokeless tobacco. She reports that she does not drink alcohol or use drugs.  ROS: UROLOGY Frequent Urination?: No Hard to postpone urination?: No Burning/pain with urination?: No Get up at night to urinate?: No Leakage of urine?: No Urine stream starts and stops?: No Trouble starting stream?: No  Do you have to strain to urinate?: No Blood in urine?: No Urinary tract infection?: No Sexually transmitted disease?: No Injury to kidneys or bladder?: No Painful intercourse?: No Weak stream?: No Currently pregnant?: No Vaginal bleeding?: No Last menstrual period?: n  Gastrointestinal Nausea?: No Vomiting?: No Indigestion/heartburn?: No Diarrhea?: No Constipation?: No  Constitutional Fever: No Night sweats?: No Weight loss?: No Fatigue?: No  Skin Skin rash/lesions?: No Itching?: No  Eyes Blurred vision?: No Double vision?: No  Ears/Nose/Throat Sore throat?: No Sinus problems?: No  Hematologic/Lymphatic Swollen glands?: No Easy bruising?: No  Cardiovascular Leg swelling?:  No Chest pain?: No  Respiratory Cough?: No Shortness of breath?: No  Endocrine Excessive thirst?: No  Musculoskeletal Back pain?: No Joint pain?: No  Neurological Headaches?: No Dizziness?: No  Psychologic Depression?: No Anxiety?: No  Physical Exam: BP (!) 158/108 (BP Location: Left Arm, Patient Position: Sitting, Cuff Size: Large)   Pulse 94   Ht 5\' 3"  (1.6 m)   Wt 145 lb (65.8 kg)   BMI 25.69 kg/m   Constitutional: Well nourished. Alert and oriented, No acute distress. HEENT: [place] AT, moist mucus membranes. Trachea midline, no masses. Cardiovascular: No clubbing, cyanosis, or edema. Respiratory: Normal respiratory effort, no increased work of breathing. Skin: No rashes, bruises or suspicious lesions. Lymph: No cervical or inguinal adenopathy. Neurologic: Grossly intact, no focal deficits, moving all 4 extremities. Psychiatric: Normal mood and affect.  Laboratory Data: Urinalysis 11-30 WBC's, many bacteria and nitrite positive.  See Epic.   Pertinent Imaging:  Results for orders placed or performed in visit on 04/04/17  Bladder Scan (Post Void Residual) in office  Result Value Ref Range   Scan Result 35     CT abdomen and pelvis, without and with IV contrast, 11/13/2014  Comparison:  None.  Indication:  R31.2 Other microscopic hematuria, microhematuria.  Technique:  CT imaging was performed of the abdomen and pelvis without and with IV contrast. The patient received intravenous contrast (Isovue-300, 150 mL at 3 mL/sec) without incident.  A genitourinary protocol CT was performed, with noncontrast imaging from the kidneys through the bladder, nephrographic phase imaging through the kidneys, and excretory phase imaging from the kidneys through the bladder.   Iodinated contrast was used due to the indications for the examination, to improve disease detection and to further define anatomy. This examination was interpreted using coronal reformatted images  to optimize visualization of the collecting system and ureters. The most recent serum creatinine is 0.7 mg/dL on 1/61/09608/25/2016.    Findings:  The imaged heart and lung bases are unremarkable.  Punctate calculus in the left renal superior pole (series 2 image 15) is nonspecific. No hydronephrosis. 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 The ureters are nondilated bilaterally. The left and right ureters are patent. No filling defects are seen within the urinary bladder. The urinary bladder is normal.  The liver is normal in size and contour. A rounded 1.2 cm low-attenuation lesion in the left hepatic lobe (series 3 image 46) measures fluid attenuation and is favored to represent a hepatic cyst. No biliary ductal dilatation. The hepatic and portal veins are patent. The gallbladder is normal. The spleen, pancreas, and adrenal glands are normal. The small and large bowel are normal in caliber. No free intraperitoneal air. No free fluid. No mesenteric, retroperitoneal, or pelvic sidewall lymphadenopathy.  Sclerotic changes are seen at the pubic symphysis and the  bilateral SI joints. No acute or aggressive osseous lesions.  Impression: 1. Punctate renal calculus in the left superior renal pole. No hydronephrosis. No hydroureter. No masses are identified in the collecting system. 2. 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  The preliminary report (critical or emergent communication) was reviewed prior to this dictation and there are no substantial differences between the preliminary results and the impressions in this final report.  Electronically Reviewed by:  Lyla Glassing, MD Electronically Reviewed on:  11/14/2014 10:37 AM  I have reviewed the images and concur with  the above findings.  Electronically Signed by:  Manfred Shirts MD Electronically Signed on:  11/14/2014 12:36 PM   Assessment & Plan:    1. History of hematuria  - completed hematuria work up in 2017 - no worrisome findings  - no reports of gross hematuria  - UA was negative for hematuria  2. Left punctate renal stone  - no intervention warranted at this time  3. Frequency  - will have a trial of Ditropan XL 10 mg daily, script sent to pharmacy  -  I have advised of the side effects such as: Dry eyes, dry mouth, constipation, mental confusion and/or urinary retention.  - RTC in 6 weeks for PVR and OAB questionnaire  4. Nocturia  - I explained to the patient that nocturia is often multi-factorial and difficult to treat.  Sleeping disorders, heart conditions, peripheral vascular disease, diabetes, an enlarged prostate for men, an urethral stricture causing bladder outlet obstruction and/or certain medications can contribute to nocturia.  - I have suggested that the patient avoid caffeine after noon and alcohol in the evening.  He or she may also benefit from fluid restrictions after 6:00 in the evening and voiding just prior to bedtime.  - I have explained that research studies have showed that over 84% of patients with sleep apnea reported frequent nighttime urination.   With sleep apnea, oxygen decreases, carbon dioxide increases, the blood become more acidic, the heart rate drops and blood vessels in the lung constrict.  The body is then alerted that something is very wrong. The sleeper must wake enough to reopen the airway. By this time, the heart is racing and experiences a false signal of fluid overload. The heart excretes a hormone-like protein that tells the body to get rid of sodium and water, resulting in nocturia.  -  I also informed the patient that a recent study noted that decreasing sodium intake to 2.3 grams daily, if they don't have issues with hyponatremia, can also reduce the  number of nightly voids  - There is also an increased incidence in sleep apnea with menopause, symptoms include night sweats, daytime sleepiness, depressed mood, and cognitive complaints like poor concentration or problems with short-term memory   - The patient may benefit from a discussion with his or her primary care physician to see if he or she has risk factors for sleep apnea or other sleep disturbances and obtaining a sleep study.  5. UTI  - UA is suspicious for infection - will send for culture - will prescribe antibiotic once culture is available if appropriate   6. HTN  - advised patient to follow up with PCP   Return in about 6 weeks (around 05/16/2017) for PVR and OAB questionnaire.  These notes generated with voice recognition software. I apologize for typographical errors.  Starlynn Klinkner, PA-C  [facility] [address], [place], [place] [address] [phone]

## 2017-04-04 ENCOUNTER — Encounter: Payer: Self-pay | Admitting: Urology

## 2017-04-04 ENCOUNTER — Ambulatory Visit (INDEPENDENT_AMBULATORY_CARE_PROVIDER_SITE_OTHER): Payer: Medicaid Other | Admitting: Urology

## 2017-04-04 VITALS — BP 158/108 | HR 94 | Ht 63.0 in | Wt 145.0 lb

## 2017-04-04 DIAGNOSIS — I10 Essential (primary) hypertension: Secondary | ICD-10-CM

## 2017-04-04 DIAGNOSIS — R351 Nocturia: Secondary | ICD-10-CM

## 2017-04-04 DIAGNOSIS — R350 Frequency of micturition: Secondary | ICD-10-CM

## 2017-04-04 DIAGNOSIS — Z87448 Personal history of other diseases of urinary system: Secondary | ICD-10-CM

## 2017-04-04 DIAGNOSIS — N200 Calculus of kidney: Secondary | ICD-10-CM | POA: Diagnosis not present

## 2017-04-04 DIAGNOSIS — N3000 Acute cystitis without hematuria: Secondary | ICD-10-CM

## 2017-04-04 LAB — BLADDER SCAN AMB NON-IMAGING: Scan Result: 35

## 2017-04-04 MED ORDER — OXYBUTYNIN CHLORIDE ER 10 MG PO TB24: 10.0000 mg | ORAL_TABLET | Freq: Every day | ORAL | 3 refills | Status: DC

## 2017-04-05 LAB — URINALYSIS, COMPLETE
BILIRUBIN UA: NEGATIVE
GLUCOSE, UA: NEGATIVE
Nitrite, UA: POSITIVE — AB
PH UA: 6 (ref 5.0–7.5)
Specific Gravity, UA: >1.03 — ABNORMAL HIGH (ref 1.005–1.030)
UUROB: 0.2 mg/dL (ref 0.2–1.0)

## 2017-04-05 LAB — MICROSCOPIC EXAMINATION: Epithelial Cells (non renal): NONE SEEN /hpf (ref 0–10)

## 2017-04-07 ENCOUNTER — Telehealth: Payer: Self-pay | Admitting: Family Medicine

## 2017-04-07 LAB — CULTURE, URINE COMPREHENSIVE

## 2017-04-07 MED ORDER — AMOXICILLIN-POT CLAVULANATE 875-125 MG PO TABS: 1.0000 | ORAL_TABLET | Freq: Two times a day (BID) | ORAL | 0 refills | Status: DC

## 2017-04-07 NOTE — Telephone Encounter (Signed)
-----   Message from Harle BattiestShannon A McGowan, PA-C sent at 04/07/2017  3:47 PM EST ----- Please let Mrs. Allene Dillonorres know that her urine culture was positive.  She needs to start Augmentin 875/125, one bid x 7 days

## 2017-04-07 NOTE — Telephone Encounter (Signed)
Left message for patient to get ABX from [facility]

## 2017-05-18 ENCOUNTER — Ambulatory Visit: Payer: Medicaid Other | Admitting: Urology

## 2017-06-08 ENCOUNTER — Telehealth: Payer: Self-pay | Admitting: Gastroenterology

## 2017-06-08 NOTE — Telephone Encounter (Signed)
Patient called to schedule her colonoscopy °

## 2017-06-16 NOTE — Telephone Encounter (Signed)
Returned patients call.  Requested call back.

## 2017-09-09 NOTE — Progress Notes (Signed)
09/11/2017 4:20 PM   Kimberly LeberRegina Lakeman [birth year] [id number]  Referring provider: [name], Mario Ernesto, MD [address] [address] [place], [place] [id number]  Chief Complaint  Patient presents with  . Hematuria    HPI: 57 yo AAF with a history of hematuria, left renal stone, frequency and nocturia who presents today for follow up.  History of hematuria  She underwent [facility].  She was found to have a punctate calculus in the left renal superior pole. 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  She is not a smoker.  Cystoscopy performed on 01/22/2016 with Dr. [name] was negative.   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  She is not exposed to industrial chemicals.   She is not experience gross hematuria.  Her UA today was negative for AMH.  Left punctate renal stone No flank pain or passage of fragment.    Frequency Today, she is experiencing urgency x 0-3 (stable), frequency x 0-3 (stable), not restricting fluids to avoid visits to the restroom, is engaging in toilet mapping, incontinence x 0-3 (stable) and nocturia x 0-3 (stable).   Her PVR is 32 mL.  She is not having fevers, chills, nausea or vomiting.  She is not having dysuria, gross hematuria or suprapubic pain.  She states the oxybutynin XL 10 mg pill is hard to swallow.  She would like something different.    Nocturia  She has still not spoken with her PCP regarding sleep apnea.     PMH: Past Medical History:  Diagnosis Date  . Hematuria     Surgical History: Past Surgical History:  Procedure Laterality Date  . CESAREAN SECTION      Home Medications:  Allergies as of 09/11/2017   No Known Allergies     Medication List    as of 09/11/2017  4:20 PM   You have not been prescribed any medications.     Allergies: No Known Allergies  Family History: Family History   Problem Relation Age of Onset  . Prostate cancer Neg Hx   . Kidney disease Neg Hx   . Kidney cancer Neg Hx     Social History:  reports that she has never smoked. She has never used smokeless tobacco. She reports that she does not drink alcohol or use drugs.  ROS: UROLOGY Frequent Urination?: No Hard to postpone urination?: No Burning/pain with urination?: No Get up at night to urinate?: No Leakage of urine?: No Urine stream starts and stops?: No Trouble starting stream?: No Do you have to strain to urinate?: No Blood in urine?: No Urinary tract infection?: No Sexually transmitted disease?: No Injury to kidneys or bladder?: No Painful intercourse?: No Weak stream?: No Currently pregnant?: No Vaginal bleeding?: No Last menstrual period?: N/A  Gastrointestinal Nausea?: No Vomiting?: No Indigestion/heartburn?: No Diarrhea?: No Constipation?: No  Constitutional Fever: No Night sweats?: No Weight loss?: No Fatigue?: No  Skin Skin rash/lesions?: No Itching?: No  Eyes Blurred vision?: No Double vision?: No  Ears/Nose/Throat Sore throat?: No Sinus problems?: No  Hematologic/Lymphatic Swollen glands?: No Easy bruising?: No  Cardiovascular Leg swelling?: No Chest pain?: No  Respiratory Cough?: No Shortness of breath?: No  Endocrine Excessive thirst?: No  Musculoskeletal Back pain?: No Joint pain?: No  Neurological Headaches?: No Dizziness?: No  Psychologic Depression?: No Anxiety?: No  Physical Exam: BP (!) 136/92 (BP Location: Right Arm, Patient Position: Sitting, Cuff Size: Large)   Pulse (!) 118   Ht 5\' 3"  (1.6 m)   LMP 04/20/2015 (Approximate)   BMI 25.69 kg/m   Constitutional: Well nourished. Alert and oriented, No acute distress. HEENT: [place] AT, moist mucus membranes. Trachea midline, no masses. Cardiovascular: No clubbing, cyanosis, or edema. Respiratory: Normal respiratory effort, no increased work of breathing. Skin: No  rashes, bruises or suspicious lesions. Lymph: No cervical or inguinal adenopathy. Neurologic: Grossly intact, no focal deficits, moving all 4 extremities. Psychiatric: Normal mood and affect.  Laboratory Data: Urinalysis See HPI and Epic.  I have reviewed the labs.   Pertinent Imaging Results for orders placed or performed in visit on 09/11/17  Bladder Scan (Post Void Residual) in office  Result Value Ref Range   Scan Result 32mL    Assessment & Plan:    1. History of hematuria Completed hematuria work up in 2017 - no worrisome findings No reports of gross hematuria UA was negative for hematuria RTC in one year for UA  2. Left punctate renal stone No intervention warranted at this time  3. Frequency Will send in script for oxybutynin syrup, 5 mL bid RTC in one year for OAB questionnaire and PVR  4. Nocturia Encouraged patient to speak with PCP regarding risk factors for sleep apnea  Return in about 1 year (around 09/12/2018) for PVR, UA and OAB questionnaire.  These notes generated with voice recognition software. I apologize for typographical errors.  Michiel Cowboy, PA-C  [facility] [address] [place], [place] [address] [phone]

## 2017-09-11 ENCOUNTER — Ambulatory Visit (INDEPENDENT_AMBULATORY_CARE_PROVIDER_SITE_OTHER): Payer: Self-pay | Admitting: Urology

## 2017-09-11 ENCOUNTER — Encounter: Payer: Self-pay | Admitting: Urology

## 2017-09-11 VITALS — BP 136/92 | HR 118 | Ht 63.0 in

## 2017-09-11 DIAGNOSIS — R351 Nocturia: Secondary | ICD-10-CM

## 2017-09-11 DIAGNOSIS — N200 Calculus of kidney: Secondary | ICD-10-CM

## 2017-09-11 DIAGNOSIS — R350 Frequency of micturition: Secondary | ICD-10-CM

## 2017-09-11 DIAGNOSIS — Z87448 Personal history of other diseases of urinary system: Secondary | ICD-10-CM

## 2017-09-11 LAB — BLADDER SCAN AMB NON-IMAGING

## 2017-09-11 MED ORDER — OXYBUTYNIN CHLORIDE 5 MG/5ML PO SYRP: 5.0000 mg | ORAL_SOLUTION | Freq: Two times a day (BID) | ORAL | 3 refills | Status: DC

## 2017-09-12 LAB — MICROSCOPIC EXAMINATION: RBC MICROSCOPIC, UA: NONE SEEN /HPF (ref 0–2)

## 2017-09-12 LAB — URINALYSIS, COMPLETE
Bilirubin, UA: NEGATIVE
GLUCOSE, UA: NEGATIVE
Leukocytes, UA: NEGATIVE
NITRITE UA: NEGATIVE
Specific Gravity, UA: >1.03 — ABNORMAL HIGH (ref 1.005–1.030)
UUROB: 0.2 mg/dL (ref 0.2–1.0)
pH, UA: 5 (ref 5.0–7.5)

## 2018-09-14 ENCOUNTER — Ambulatory Visit: Payer: Medicaid Other | Admitting: Urology

## 2018-09-14 ENCOUNTER — Encounter: Payer: Self-pay | Admitting: Urology

## 2018-10-16 ENCOUNTER — Telehealth: Payer: Self-pay | Admitting: Urology

## 2018-10-16 NOTE — Telephone Encounter (Signed)
Would you call Kimberly Townsend and reschedule her missed appointment for hematuria?

## 2018-10-17 NOTE — Telephone Encounter (Signed)
I called and phone went to VM and it is not set up to leave messages. I tried all 3 numbers on her acct, no answer.

## 2018-12-25 ENCOUNTER — Encounter: Payer: Self-pay | Admitting: Urology

## 2019-01-07 NOTE — Progress Notes (Signed)
01/08/2019 12:08 PM   Kimberly Townsend 1960/06/13 [id number]  Referring provider: Dione Housekeeper, MD [address] [place],  [place] [address]  Chief Complaint  Patient presents with  . Urinary Frequency    HPI: 58 yo female with a history of hematuria, left renal stone, frequency and nocturia who presents today for follow up.  History of hematuria (high risk) Non smoker.  She underwent a CT urogram at [facility] on 11/13/2014.  She was found to have a punctate calculus in the left renal superior pole. 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  Cystoscopy performed on 01/22/2016 with Dr. Apolinar Townsend was negative.   She denies any gross hematuria.  UA today is was negative for microscopic hematuria.    Left punctate renal stone No flank pain or passage of fragment.    Frequency The patient is  experiencing urgency x 0-3 (stable), frequency x 0-3 (stable), not restricting fluids to avoid visits to the restroom, is engaging in toilet mapping, incontinence x 0-3 (stable) and nocturia x 0-3 (stable).   Her BP is 161/114.   Her PVR is 0 mL.  She has not been taking her oxybutynin.    Nocturia  She states that her PCP told her she didn't need a sleep study.    PMH: Past Medical History:  Diagnosis Date  . Hematuria     Surgical History: Past Surgical History:  Procedure Laterality Date  . CESAREAN SECTION      Home Medications:  Allergies as of 01/08/2019   No Known Allergies     Medication List       Accurate as of January 08, 2019 12:08 PM. If you have any questions, ask your nurse or doctor.        oxybutynin 5 MG/5ML syrup Commonly known as: DITROPAN Take 5 mLs (5 mg total) by mouth 2 (two) times daily.       Allergies: No Known Allergies  Family History: Family History  Problem Relation Age of Onset  . Prostate cancer Neg Hx   . Kidney disease Neg  Hx   . Kidney cancer Neg Hx     Social History:  reports that she has never smoked. She has never used smokeless tobacco. She reports that she does not drink alcohol or use drugs.  ROS: UROLOGY Frequent Urination?: No Hard to postpone urination?: No Burning/pain with urination?: No Get up at night to urinate?: No Leakage of urine?: No Urine stream starts and stops?: No Trouble starting stream?: No Do you have to strain to urinate?: No Blood in urine?: No Urinary tract infection?: No Sexually transmitted disease?: No Injury to kidneys or bladder?: No Painful intercourse?: No Weak stream?: No Currently pregnant?: No Vaginal bleeding?: No Last menstrual period?: n  Gastrointestinal Nausea?: No Vomiting?: No Indigestion/heartburn?: No Diarrhea?: No Constipation?: No  Constitutional Fever: No Night sweats?: No Weight loss?: No Fatigue?: No  Skin Skin rash/lesions?: No Itching?: No  Eyes Blurred vision?: No Double vision?: No  Ears/Nose/Throat Sore throat?: No Sinus problems?: No  Hematologic/Lymphatic Swollen glands?: No Easy bruising?: No  Cardiovascular Leg swelling?: No Chest pain?: No  Respiratory Cough?: No Shortness of breath?: No  Endocrine Excessive thirst?: No  Musculoskeletal Back pain?: No Joint pain?: No  Neurological Headaches?: No Dizziness?: No  Psychologic Depression?: No Anxiety?: No  Physical Exam: BP (!) 161/114   Pulse (!) 102  Ht 5\' 3"  (1.6 m)   LMP 04/20/2015 (Approximate)   BMI 25.69 kg/m   Constitutional:  Well nourished. Alert and oriented, No acute distress. HEENT: [place] AT, moist mucus membranes.  Trachea midline, no masses. Cardiovascular: No clubbing, cyanosis, or edema. Respiratory: Normal respiratory effort, no increased work of breathing. Skin: No rashes, bruises or suspicious lesions. Neurologic: Grossly intact, no focal deficits, moving all 4 extremities. Psychiatric: Normal mood and affect.    Laboratory Data: Urinalysis Component     Latest Ref Rng & Units 01/08/2019  Specific Gravity, UA     1.005 - 1.030 >1.030 (H)  pH, UA     5.0 - 7.5 6.0  Color, UA     Yellow Yellow  Appearance Ur     Clear Cloudy (A)  Leukocytes,UA     Negative Negative  Protein,UA     Negative/Trace Trace (A)  Glucose, UA     Negative Negative  Ketones, UA     Negative Negative  RBC, UA     Negative 1+ (A)  Bilirubin, UA     Negative Negative  Urobilinogen, Ur     0.2 - 1.0 mg/dL 0.2  Nitrite, UA     Negative Negative  Microscopic Examination      See below:   Component     Latest Ref Rng & Units 01/08/2019  WBC, UA     0 - 5 /hpf 0-5  RBC     0 - 2 /hpf None seen  Epithelial Cells (non renal)     0 - 10 /hpf 0-10  Bacteria, UA     None seen/Few Many (A)   I have reviewed the labs.   Pertinent Imaging Results for Kimberly Townsend (MRN [id number]) as of 01/15/2019 16:39  Ref. Range 01/08/2019 11:35  Scan Result Unknown 0    Assessment & Plan:    1. History of hematuria Completed hematuria work up in 2017 - no worrisome findings No report of gross hematuria UA was negative for hematuria RTC in one year for UA Patient to report any gross hematuria in the interim  2. Left punctate renal stone No intervention warranted at this time  3. Frequency Continue oxybutynin 5 mg/63mL syrup BID RTC in 6 weeks OAB questionnaire and PVR  4. Nocturia Sleep apnea has been ruled out   Return in about 6 weeks (around 02/19/2019) for PVR and OAB questionnaire.  These notes generated with voice recognition software. I apologize for typographical errors.  Kimberly Council, PA-C  [facility] [address] [place], [place] [address] [phone]

## 2019-01-08 ENCOUNTER — Encounter: Payer: Self-pay | Admitting: Urology

## 2019-01-08 ENCOUNTER — Other Ambulatory Visit: Payer: Self-pay

## 2019-01-08 ENCOUNTER — Ambulatory Visit (INDEPENDENT_AMBULATORY_CARE_PROVIDER_SITE_OTHER): Payer: Commercial Managed Care - PPO | Admitting: Urology

## 2019-01-08 VITALS — BP 161/114 | HR 102 | Ht 63.0 in

## 2019-01-08 DIAGNOSIS — Z87448 Personal history of other diseases of urinary system: Secondary | ICD-10-CM

## 2019-01-08 DIAGNOSIS — R350 Frequency of micturition: Secondary | ICD-10-CM | POA: Diagnosis not present

## 2019-01-08 LAB — BLADDER SCAN AMB NON-IMAGING: Scan Result: 0

## 2019-01-08 MED ORDER — OXYBUTYNIN CHLORIDE 5 MG/5ML PO SYRP: 5.0000 mg | ORAL_SOLUTION | Freq: Two times a day (BID) | ORAL | 3 refills | Status: DC

## 2019-01-09 LAB — URINALYSIS, COMPLETE
Bilirubin, UA: NEGATIVE
Glucose, UA: NEGATIVE
Ketones, UA: NEGATIVE
Leukocytes,UA: NEGATIVE
Nitrite, UA: NEGATIVE
Specific Gravity, UA: >1.03 — ABNORMAL HIGH (ref 1.005–1.030)
Urobilinogen, Ur: 0.2 mg/dL (ref 0.2–1.0)
pH, UA: 6 (ref 5.0–7.5)

## 2019-01-09 LAB — MICROSCOPIC EXAMINATION: RBC: NONE SEEN /hpf (ref 0–2)

## 2019-02-21 NOTE — Progress Notes (Deleted)
02/22/2019 10:07 PM   Kimberly Townsend [birth year] [id number]  Referring provider: Dione Housekeeper, MD [address] [place],  [place] [address]  No chief complaint on file.   HPI: 58 yo female with a history of hematuria, left renal stone, frequency and nocturia who presents today for follow up.  History of hematuria (high risk) Non smoker.  She underwent a CT urogram at [facility] on 11/13/2014.  She was found to have a punctate calculus in the left renal superior pole. 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  Cystoscopy performed on 01/22/2016 with Dr. Apolinar Junes was negative.   She denies any gross hematuria.  UA today is was negative for microscopic hematuria.    Left punctate renal stone No flank pain or passage of fragment.    Frequency The patient is  experiencing urgency x 0-3 (stable), frequency x 0-3 (stable), not restricting fluids to avoid visits to the restroom, is engaging in toilet mapping, incontinence x 0-3 (stable) and nocturia x 0-3 (stable).   Her BP is 161/114.   Her PVR is 0 mL.  She has not been taking her oxybutynin.    Nocturia  She states that her PCP told her she didn't need a sleep study.    PMH: Past Medical History:  Diagnosis Date  . Hematuria     Surgical History: Past Surgical History:  Procedure Laterality Date  . CESAREAN SECTION      Home Medications:  Allergies as of 02/22/2019   No Known Allergies     Medication List       Accurate as of February 21, 2019 10:07 PM. If you have any questions, ask your nurse or doctor.        oxybutynin 5 MG/5ML syrup Commonly known as: DITROPAN Take 5 mLs (5 mg total) by mouth 2 (two) times daily.       Allergies: No Known Allergies  Family History: Family History  Problem Relation Age of Onset  . Prostate cancer Neg Hx   . Kidney disease Neg Hx   . Kidney cancer Neg Hx      Social History:  reports that she has never smoked. She has never used smokeless tobacco. She reports that she does not drink alcohol or use drugs.  ROS:                                        Physical Exam: LMP 04/20/2015 (Approximate)   Constitutional:  Well nourished. Alert and oriented, No acute distress. HEENT: [place] AT, moist mucus membranes.  Trachea midline, no masses. Cardiovascular: No clubbing, cyanosis, or edema. Respiratory: Normal respiratory effort, no increased work of breathing. GI: Abdomen is soft, non tender, non distended, no abdominal masses. Liver and spleen not palpable.  No hernias appreciated.  Stool sample for occult testing is not indicated.   GU: No CVA tenderness.  No bladder fullness or masses.  *** external genitalia, *** pubic hair distribution, no lesions.  Normal urethral meatus, no lesions, no prolapse, no discharge.   No urethral masses, tenderness and/or tenderness. No bladder fullness, tenderness or masses. *** vagina mucosa, *** estrogen effect, no discharge, no lesions, *** pelvic support, *** cystocele and *** rectocele noted.  No cervical motion tenderness.  Uterus is freely mobile and non-fixed.  No  adnexal/parametria masses or tenderness noted.  Anus and perineum are without rashes or lesions.   ***  Skin: No rashes, bruises or suspicious lesions. Lymph: No cervical or inguinal adenopathy. Neurologic: Grossly intact, no focal deficits, moving all 4 extremities. Psychiatric: Normal mood and affect.   Laboratory Data: Urinalysis Component     Latest Ref Rng & Units 01/08/2019  Specific Gravity, UA     1.005 - 1.030 >1.030 (H)  pH, UA     5.0 - 7.5 6.0  Color, UA     Yellow Yellow  Appearance Ur     Clear Cloudy (A)  Leukocytes,UA     Negative Negative  Protein,UA     Negative/Trace Trace (A)  Glucose, UA     Negative Negative  Ketones, UA     Negative Negative  RBC, UA     Negative 1+ (A)  Bilirubin, UA      Negative Negative  Urobilinogen, Ur     0.2 - 1.0 mg/dL 0.2  Nitrite, UA     Negative Negative  Microscopic Examination      See below:   Component     Latest Ref Rng & Units 01/08/2019  WBC, UA     0 - 5 /hpf 0-5  RBC     0 - 2 /hpf None seen  Epithelial Cells (non renal)     0 - 10 /hpf 0-10  Bacteria, UA     None seen/Few Many (A)   I have reviewed the labs.   Pertinent Imaging ***   Assessment & Plan:    1. History of hematuria Completed hematuria work up in 2017 - no worrisome findings No report of gross hematuria UA was negative for hematuria RTC in one year for UA Patient to report any gross hematuria in the interim  2. Left punctate renal stone No intervention warranted at this time  3. Frequency Continue oxybutynin 5 mg/52mL syrup BID RTC in 6 weeks OAB questionnaire and PVR  4. Nocturia Sleep apnea has been ruled out   No follow-ups on file.  These notes generated with voice recognition software. I apologize for typographical errors.  Zara Council, PA-C  [facility] [address] [place],  [address] [phone]

## 2019-02-22 ENCOUNTER — Ambulatory Visit: Payer: Commercial Managed Care - PPO | Admitting: Urology

## 2019-03-12 NOTE — Progress Notes (Deleted)
03/13/2019 10:24 PM   Kimberly Townsend 06-06-1960 [id number]  Referring provider: Dione Housekeeper, MD [address] [place],  [place] [address]  No chief complaint on file.   HPI: 58 yo female with a history of hematuria, left renal stone, frequency and nocturia who presents today for follow up.  History of hematuria (high risk) Non smoker.  She underwent a CT urogram at [facility] on 11/13/2014.  She was found to have a punctate calculus in the left renal superior pole. 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  Cystoscopy performed on 01/22/2016 with Dr. Apolinar Junes was negative.   She denies any gross hematuria.  UA today is was negative for microscopic hematuria.    Left punctate renal stone No flank pain or passage of fragment.    Frequency The patient is  experiencing urgency x 0-3 (stable), frequency x 0-3 (stable), not restricting fluids to avoid visits to the restroom, is engaging in toilet mapping, incontinence x 0-3 (stable) and nocturia x 0-3 (stable).   Her BP is 161/114.   Her PVR is 0 mL.  She has not been taking her oxybutynin.    Nocturia  She states that her PCP told her she didn't need a sleep study.    PMH: Past Medical History:  Diagnosis Date  . Hematuria     Surgical History: Past Surgical History:  Procedure Laterality Date  . CESAREAN SECTION      Home Medications:  Allergies as of 03/13/2019   No Known Allergies     Medication List       Accurate as of March 12, 2019 10:24 PM. If you have any questions, ask your nurse or doctor.        oxybutynin 5 MG/5ML syrup Commonly known as: DITROPAN Take 5 mLs (5 mg total) by mouth 2 (two) times daily.       Allergies: No Known Allergies  Family History: Family History  Problem Relation Age of Onset  . Prostate cancer Neg Hx   . Kidney disease Neg Hx   . Kidney cancer Neg Hx      Social History:  reports that she has never smoked. She has never used smokeless tobacco. She reports that she does not drink alcohol or use drugs.  ROS:                                        Physical Exam: LMP 04/20/2015 (Approximate)   Constitutional:  Well nourished. Alert and oriented, No acute distress. HEENT: [place] AT, moist mucus membranes.  Trachea midline, no masses. Cardiovascular: No clubbing, cyanosis, or edema. Respiratory: Normal respiratory effort, no increased work of breathing. GI: Abdomen is soft, non tender, non distended, no abdominal masses. Liver and spleen not palpable.  No hernias appreciated.  Stool sample for occult testing is not indicated.   GU: No CVA tenderness.  No bladder fullness or masses.  *** external genitalia, *** pubic hair distribution, no lesions.  Normal urethral meatus, no lesions, no prolapse, no discharge.   No urethral masses, tenderness and/or tenderness. No bladder fullness, tenderness or masses. *** vagina mucosa, *** estrogen effect, no discharge, no lesions, *** pelvic support, *** cystocele and *** rectocele noted.  No cervical motion tenderness.  Uterus is freely mobile and non-fixed.  No  adnexal/parametria masses or tenderness noted.  Anus and perineum are without rashes or lesions.   ***  Skin: No rashes, bruises or suspicious lesions. Lymph: No cervical or inguinal adenopathy. Neurologic: Grossly intact, no focal deficits, moving all 4 extremities. Psychiatric: Normal mood and affect.   Laboratory Data: Urinalysis Component     Latest Ref Rng & Units 01/08/2019  Specific Gravity, UA     1.005 - 1.030 >1.030 (H)  pH, UA     5.0 - 7.5 6.0  Color, UA     Yellow Yellow  Appearance Ur     Clear Cloudy (A)  Leukocytes,UA     Negative Negative  Protein,UA     Negative/Trace Trace (A)  Glucose, UA     Negative Negative  Ketones, UA     Negative Negative  RBC, UA     Negative 1+ (A)  Bilirubin, UA      Negative Negative  Urobilinogen, Ur     0.2 - 1.0 mg/dL 0.2  Nitrite, UA     Negative Negative  Microscopic Examination      See below:   Component     Latest Ref Rng & Units 01/08/2019  WBC, UA     0 - 5 /hpf 0-5  RBC     0 - 2 /hpf None seen  Epithelial Cells (non renal)     0 - 10 /hpf 0-10  Bacteria, UA     None seen/Few Many (A)   I have reviewed the labs.   Pertinent Imaging ***   Assessment & Plan:    1. History of hematuria Completed hematuria work up in 2017 - no worrisome findings No report of gross hematuria UA was negative for hematuria RTC in one year for UA Patient to report any gross hematuria in the interim  2. Left punctate renal stone No intervention warranted at this time  3. Frequency Continue oxybutynin 5 mg/64mL syrup BID RTC in 6 weeks OAB questionnaire and PVR  4. Nocturia Sleep apnea has been ruled out   No follow-ups on file.  These notes generated with voice recognition software. I apologize for typographical errors.  Zara Council, PA-C  [facility] [address] [place], [place] [address] [phone]

## 2019-03-13 ENCOUNTER — Ambulatory Visit: Payer: Commercial Managed Care - PPO | Admitting: Urology

## 2019-07-13 ENCOUNTER — Emergency Department: Payer: Medicaid Other

## 2019-07-13 ENCOUNTER — Encounter: Payer: Self-pay | Admitting: Emergency Medicine

## 2019-07-13 ENCOUNTER — Other Ambulatory Visit: Payer: Self-pay

## 2019-07-13 ENCOUNTER — Emergency Department
Admission: EM | Admit: 2019-07-13 | Discharge: 2019-07-13 | Disposition: A | Discharge status: Home / Self Care | Payer: Medicaid Other | Attending: Emergency Medicine | Admitting: Emergency Medicine

## 2019-07-13 DIAGNOSIS — Z79899 Other long term (current) drug therapy: Secondary | ICD-10-CM | POA: Insufficient documentation

## 2019-07-13 DIAGNOSIS — J069 Acute upper respiratory infection, unspecified: Secondary | ICD-10-CM | POA: Insufficient documentation

## 2019-07-13 LAB — GROUP A STREP BY PCR: Group A Strep by PCR: NOT DETECTED

## 2019-07-13 MED ORDER — PSEUDOEPH-BROMPHEN-DM 30-2-10 MG/5ML PO SYRP: 5.0000 mL | ORAL_SOLUTION | Freq: Four times a day (QID) | ORAL | 0 refills | Status: DC | PRN

## 2019-07-13 NOTE — ED Triage Notes (Signed)
Pt to ED via POV c/o cough and congestion. Pt is in NAD.

## 2019-07-13 NOTE — ED Provider Notes (Signed)
[facility] Emergency Department Provider Note   ____________________________________________   First MD Initiated Contact with Patient 07/13/19 1320     (approximate)  I have reviewed the triage vital signs and the nursing notes.   HISTORY  Chief Complaint Cough    HPI Kimberly Townsend is a 59 y.o. female patient complain of cough and chest congestion for few days.  Patient states he status post 1 weeks second Covid vaccine injection.  Patient also complain of sore throat and decreased was positive.  Patient denies recent travel.  Patient denies fever/chills.  Patient does complain of body aches that has increased since second injection.  Patient denies nausea, vomiting, diarrhea.        Postop shoe Past Medical History:  Diagnosis Date  . Hematuria     Patient Active Problem List   Diagnosis Date Noted  . Asymptomatic proteinuria 03/09/2014  . Blood in the urine 03/09/2014  . Excessive urination at night 03/09/2014  . Borderline diabetes mellitus 03/09/2014    Past Surgical History:  Procedure Laterality Date  . CESAREAN SECTION      Prior to Admission medications   Medication Sig Start Date End Date Taking? Authorizing Provider  brompheniramine-pseudoephedrine-DM 30-2-10 MG/5ML syrup Take 5 mLs by mouth 4 (four) times daily as needed. 07/13/19   Joni Reining, PA-C  oxybutynin (DITROPAN) 5 MG/5ML syrup Take 5 mLs (5 mg total) by mouth 2 (two) times daily. 01/08/19   Michiel Cowboy A, PA-C    Allergies Patient has no known allergies.  Family History  Problem Relation Age of Onset  . Prostate cancer Neg Hx   . Kidney disease Neg Hx   . Kidney cancer Neg Hx     Social History Social History   Tobacco Use  . Smoking status: Never Smoker  . Smokeless tobacco: Never Used  Substance Use Topics  . Alcohol use: No  . Drug use: No    Review of Systems Constitutional: No fever/chills Eyes: No visual changes. ENT: Sore throat.  Cardiovascular: Denies chest pain. Respiratory: Denies shortness of breath.  Nonproductive cough and chest congestion.  ED ED to ED Gastrointestinal: No abdominal pain.  No nausea, no vomiting.  No diarrhea.  No constipation. Genitourinary: Negative for dysuria. Musculoskeletal: Negative for back pain. Skin: Negative for rash. Neurological: Negative for headaches, focal weakness or numbness.  ____________________________________________   PHYSICAL EXAM:  VITAL SIGNS: ED Triage Vitals [07/13/19 1150]  Enc Vitals Group     BP (!) 146/83     Pulse Rate (!) 112     Resp 16     Temp 98.3 F (36.8 C)     Temp src      SpO2 100 %     Weight 158 lb (71.7 kg)     Height 5\' 4"  (1.626 m)     Head Circumference      Peak Flow      Pain Score 0     Pain Loc      Pain Edu?      Excl. in GC?     Constitutional: Alert and oriented. Well appearing and in no acute distress. Eyes: Conjunctivae are normal. PERRL. EOMI. Head: Atraumatic. Nose: Edematous nasal turbinates with clear rhinorrhea.  Mouth/Throat: Mucous membranes are moist.  Oropharynx erythematous.  Decreased voice volume Neck: No stridor.   Hematological/Lymphatic/Immunilogical: No cervical lymphadenopathy. Cardiovascular: Normal rate, regular rhythm. Grossly normal heart sounds.  Good peripheral circulation. Respiratory: Normal respiratory effort.  No retractions. Lungs  CTAB. Genitourinary: Deferred Musculoskeletal: No lower extremity tenderness nor edema.  No joint effusions. Neurologic:  Normal speech and language. No gross focal neurologic deficits are appreciated. No gait instability. Skin:  Skin is warm, dry and intact. No rash noted. Psychiatric: Mood and affect are normal. Speech and behavior are normal.  ____________________________________________   LABS (all labs ordered are listed, but only abnormal results are displayed)  Labs Reviewed  GROUP A STREP BY PCR   ____________________________________________   EKG   ____________________________________________  RADIOLOGY  ED MD interpretation:    Official radiology report(s): DG Chest Portable 1 View  Result Date: 07/13/2019 CLINICAL DATA:  Cough and congestion. EXAM: PORTABLE CHEST 1 VIEW COMPARISON:  None. FINDINGS: The heart, hila, mediastinum, lungs, and pleura are normal. No focal infiltrates, nodules, or masses. IMPRESSION: No active disease. Electronically Signed   By: Dorise Bullion III M.D   On: 07/13/2019 14:19    ____________________________________________   PROCEDURES  Procedure(s) performed (including Critical Care):  Procedures   ____________________________________________   INITIAL IMPRESSION / ASSESSMENT AND PLAN / ED COURSE  As part of my medical decision making, I reviewed the following data within the [place]   Patient presents with cough and chest congestion.  Patient was concerned due to taking COVID-19 vaccine.  Discussed sequela of vaccine injection with patient.  Discussed negative x-ray findings of the chest with patient.  Patient given discharge care instruction advised take medication as directed.  Return to ED if condition worsens.       Kimberly Townsend was evaluated in Emergency Department on 07/13/2019 for the symptoms described in the history of present illness. She was evaluated in the context of the global COVID-19 pandemic, which necessitated consideration that the patient might be at risk for infection with the SARS-CoV-2 virus that causes COVID-19. Institutional protocols and algorithms that pertain to the evaluation of patients at risk for COVID-19 are in a state of rapid change based on information released by regulatory bodies including the CDC and federal and state organizations. These policies and algorithms were followed during the patient's care in the ED.       ____________________________________________   FINAL CLINICAL IMPRESSION(S) / ED DIAGNOSES  Final  diagnoses:  Viral URI with cough     ED Discharge Orders         Ordered    brompheniramine-pseudoephedrine-DM 30-2-10 MG/5ML syrup  4 times daily PRN     07/13/19 1440           Note:  This document was prepared using Dragon voice recognition software and may include unintentional dictation errors.    Sable Feil, PA-C 07/13/19 1443    Blake Divine, MD 07/13/19 [phone]

## 2019-08-08 ENCOUNTER — Ambulatory Visit: Payer: Commercial Managed Care - PPO | Admitting: Urology

## 2019-08-22 ENCOUNTER — Ambulatory Visit: Payer: Medicaid Other | Admitting: Urology

## 2019-08-29 ENCOUNTER — Ambulatory Visit: Payer: Medicaid Other | Admitting: Urology

## 2019-08-29 ENCOUNTER — Encounter: Payer: Self-pay | Admitting: Urology

## 2019-09-17 ENCOUNTER — Ambulatory Visit (INDEPENDENT_AMBULATORY_CARE_PROVIDER_SITE_OTHER): Payer: Self-pay | Admitting: Urology

## 2019-09-17 ENCOUNTER — Encounter: Payer: Self-pay | Admitting: Urology

## 2019-09-17 ENCOUNTER — Other Ambulatory Visit: Payer: Self-pay

## 2019-09-17 VITALS — BP 167/99 | HR 96 | Ht 64.0 in | Wt 156.0 lb

## 2019-09-17 DIAGNOSIS — R350 Frequency of micturition: Secondary | ICD-10-CM

## 2019-09-17 DIAGNOSIS — Z87448 Personal history of other diseases of urinary system: Secondary | ICD-10-CM

## 2019-09-17 DIAGNOSIS — R351 Nocturia: Secondary | ICD-10-CM

## 2019-09-17 DIAGNOSIS — N200 Calculus of kidney: Secondary | ICD-10-CM

## 2019-09-17 LAB — BLADDER SCAN AMB NON-IMAGING: Scan Result: 14

## 2019-09-17 LAB — URINALYSIS, COMPLETE
Bilirubin, UA: NEGATIVE
Glucose, UA: NEGATIVE
Ketones, UA: NEGATIVE
Leukocytes,UA: NEGATIVE
Nitrite, UA: POSITIVE — AB
Specific Gravity, UA: 1.025 (ref 1.005–1.030)
Urobilinogen, Ur: 0.2 mg/dL (ref 0.2–1.0)
pH, UA: 5.5 (ref 5.0–7.5)

## 2019-09-17 LAB — MICROSCOPIC EXAMINATION: Epithelial Cells (non renal): NONE SEEN /hpf (ref 0–10)

## 2019-09-17 NOTE — Progress Notes (Signed)
01/08/2019 2:00 PM   Kimberly Townsend 03/29/1960 [id number]  Referring provider: Dione Housekeeper, MD [address] [place],  [place] [address]  Chief Complaint  Patient presents with  . Hematuria    HPI: 59 yo female with a history of hematuria, left renal stone, frequency and nocturia who presents today for follow up.  History of hematuria (high risk) Non smoker.  She underwent a CT urogram at [facility] on 11/13/2014.  She was found to have a punctate calculus in the left renal superior pole. 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  Cystoscopy performed on 01/22/2016 with Dr. Apolinar Junes was negative.   She denies any gross hematuria.  Her UA is negative for micro heme.   Left punctate renal stone No flank pain or passage of fragment.    Frequency The patient is  experiencing urgency x 0-3 (stable), frequency x 0-3 (stable), not restricting fluids to avoid visits to the restroom, is engaging in toilet mapping, incontinence x 0-3 (stable) and nocturia x 0-3 (stable).   Her BP is 167/99.   Her PVR is 14 mL.  She takes the oxybutynin 5 mg syrup, prn.  It makes her nauseous.    Nocturia  She states that her PCP told her she didn't need a sleep study.    PMH: Past Medical History:  Diagnosis Date  . Hematuria     Surgical History: Past Surgical History:  Procedure Laterality Date  . CESAREAN SECTION      Home Medications:  Allergies as of 09/17/2019   No Known Allergies     Medication List       Accurate as of September 17, 2019  2:00 PM. If you have any questions, ask your nurse or doctor.        brompheniramine-pseudoephedrine-DM 30-2-10 MG/5ML syrup Take 5 mLs by mouth 4 (four) times daily as needed.   oxybutynin 5 MG/5ML syrup Commonly known as: DITROPAN Take 5 mLs (5 mg total) by mouth 2 (two) times daily.       Allergies: No Known Allergies  Family  History: Family History  Problem Relation Age of Onset  . Prostate cancer Neg Hx   . Kidney disease Neg Hx   . Kidney cancer Neg Hx     Social History:  reports that she has never smoked. She has never used smokeless tobacco. She reports that she does not drink alcohol and does not use drugs.  ROS: For pertinent review of systems please refer to history of present illness  Physical Exam: BP (!) 167/99   Pulse 96   Ht 5\' 4"  (1.626 m)   Wt 156 lb (70.8 kg)   LMP 04/20/2015 (Approximate)   BMI 26.78 kg/m   Constitutional:  Well nourished. Alert and oriented, No acute distress. HEENT: [place] AT, mask in place Trachea midline Cardiovascular: No clubbing, cyanosis, or edema. Respiratory: Normal respiratory effort, no increased work of breathing. Neurologic: Grossly intact, no focal deficits, moving all 4 extremities. Psychiatric: Normal mood and affect.   Laboratory Data: Urinalysis Component     Latest Ref Rng & Units 09/17/2019  Specific Gravity, UA     1.005 - 1.030 1.025  pH, UA     5.0 - 7.5 5.5  Color, UA     Yellow Yellow  Appearance Ur     Clear Cloudy (A)  Leukocytes,UA     Negative  Negative  Protein,UA     Negative/Trace 1+ (A)  Glucose, UA     Negative Negative  Ketones, UA     Negative Negative  RBC, UA     Negative 1+ (A)  Bilirubin, UA     Negative Negative  Urobilinogen, Ur     0.2 - 1.0 mg/dL 0.2  Nitrite, UA     Negative Positive (A)  Microscopic Examination      See below:   Component     Latest Ref Rng & Units 09/17/2019  WBC, UA     0 - 5 /hpf 0-5  RBC     0 - 2 /hpf 0-2  Epithelial Cells (non renal)     0 - 10 /hpf None seen  Bacteria, UA     None seen/Few Many (A)   I have reviewed the labs.   Pertinent Imaging Results for JONASIA, COINER (MRN [id number]) as of 09/17/2019 13:58  Ref. Range 09/17/2019 13:46  Scan Result Unknown 14     Assessment & Plan:    1. History of hematuria Completed hematuria work up in 2017 - no worrisome  findings No report of gross hematuria UA was negative for hematuria RTC in one year for UA Patient to report any gross hematuria in the interim  2. Left punctate renal stone No intervention warranted at this time  3. Frequency Not bothersome to patient at this time  4. Nocturia Sleep apnea has been ruled out   Return in about 1 year (around 09/16/2020) for PVR, UA and OAB questionnaire.  These notes generated with voice recognition software. I apologize for typographical errors.   [facility] [address] [place], [place] [address] [phone]  I, Francina Ames Peace, am acting as a Neurosurgeon for [employer], [employer].  I have reviewed the above documentation for accuracy and completeness, and I agree with the above.    Michiel Cowboy, PA-C

## 2020-09-15 NOTE — Progress Notes (Deleted)
01/08/2019 11:10 AM   Kimberly Townsend 26-Sep-1960 [id number]  Referring provider: Dione Housekeeper, MD [address] [place],  [place] [address]  Urological history: 1. High risk hematuria -non-smoker -CT urogram at [facility] on 11/13/2014.  She was found to have a punctate calculus in the left renal superior pole. 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 Nephrolithiasis -2 mm stone in left kidney on CTU 2016  3. OAB -contributing factors of obesity, para T, vaginal delivery, a family history of incontinence, age, caffeine, fecal incontinence, vaginal atrophy,  ***  No chief complaint on file.   HPI: Kimberly Townsend is a 60 y.o. female who presents today for yearly follow up.       PMH: Past Medical History:  Diagnosis Date   Hematuria     Surgical History: Past Surgical History:  Procedure Laterality Date   CESAREAN SECTION      Home Medications:  Allergies as of 09/16/2020   No Known Allergies      Medication List        Accurate as of September 15, 2020 11:10 AM. If you have any questions, ask your nurse or doctor.          brompheniramine-pseudoephedrine-DM 30-2-10 MG/5ML syrup Take 5 mLs by mouth 4 (four) times daily as needed.   oxybutynin 5 MG/5ML syrup Commonly known as: DITROPAN Take 5 mLs (5 mg total) by mouth 2 (two) times daily.        Allergies: No Known Allergies  Family History: Family History  Problem Relation Age of Onset   Prostate cancer Neg Hx    Kidney disease Neg Hx    Kidney cancer Neg Hx     Social History:  reports that she has never smoked. She has never used smokeless tobacco. She reports that she does not drink alcohol and does not use drugs.  ROS: For pertinent review of systems please refer to history of present  illness  Physical Exam: LMP 04/20/2015 (Approximate)   Constitutional:  Well nourished. Alert and oriented, No acute distress. HEENT: [place] AT, moist mucus membranes.  Trachea midline, no masses. Cardiovascular: No clubbing, cyanosis, or edema. Respiratory: Normal respiratory effort, no increased work of breathing. GI: Abdomen is soft, non tender, non distended, no abdominal masses. Liver and spleen not palpable.  No hernias appreciated.  Stool sample for occult testing is not indicated.   GU: No CVA tenderness.  No bladder fullness or masses.  *** external genitalia, *** pubic hair distribution, no lesions.  Normal urethral meatus, no lesions, no prolapse, no discharge.   No urethral masses, tenderness and/or tenderness. No bladder fullness, tenderness or masses. *** vagina mucosa, *** estrogen effect, no discharge, no lesions, *** pelvic support, *** cystocele and *** rectocele noted.  No cervical motion tenderness.  Uterus is freely mobile and non-fixed.  No adnexal/parametria masses or tenderness noted.  Anus and perineum are without rashes or lesions.   ***  Skin: No rashes, bruises or suspicious lesions. Lymph: No cervical or inguinal adenopathy. Neurologic: Grossly intact, no focal deficits, moving all 4 extremities. Psychiatric: Normal mood and affect.    Laboratory Data: Urinalysis ***  I have reviewed the labs.   Pertinent Imaging ***    Assessment & Plan:    1. History of hematuria Completed hematuria  work up in 2017 - no worrisome findings No report of gross hematuria UA was negative for hematuria RTC in one year for UA Patient to report any gross hematuria in the interim  2. Left punctate renal stone No intervention warranted at this time  3. Frequency Not bothersome to patient at this time  4. Nocturia Sleep apnea has been ruled out   No follow-ups on file.  These notes generated with voice recognition software. I apologize for typographical  errors.   [facility] [address] [place], [place] [address] [phone]   Michiel Cowboy, PA-C

## 2020-09-16 ENCOUNTER — Ambulatory Visit: Payer: Self-pay | Admitting: Urology

## 2020-09-18 ENCOUNTER — Encounter: Payer: Self-pay | Admitting: Urology

## 2020-12-07 ENCOUNTER — Telehealth: Payer: Self-pay | Admitting: Urology

## 2020-12-23 NOTE — Progress Notes (Deleted)
01/08/2019 1:15 PM   Kimberly Townsend [birth year] [id number]  Referring provider: Dione Housekeeper, MD [address] [place],  [place] [address]  No chief complaint on file.  Urological history: 1. 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 Nephrolithiasis -CTU 2016 - punctate calculus in the left kidney   3. Nocturia -PCP deemed her not a candidate for a sleep study  4. OAB -contributing factors of age, vaginal atrophy and diabetes  -PVR *** -managed with oxybutynin IR 5 mg syrup, prn  HPI: Kimberly Townsend is a 60 y.o. female who presents today for her yearly appointment.   PMH: Past Medical History:  Diagnosis Date   Hematuria     Surgical History: Past Surgical History:  Procedure Laterality Date   CESAREAN SECTION      Home Medications:  Allergies as of 12/24/2020   No Known Allergies      Medication List        Accurate as of December 23, 2020  1:15 PM. If you have any questions, ask your nurse or doctor.          brompheniramine-pseudoephedrine-DM 30-2-10 MG/5ML syrup Take 5 mLs by mouth 4 (four) times daily as needed.   oxybutynin 5 MG/5ML syrup Commonly known as: DITROPAN Take 5 mLs (5 mg total) by mouth 2 (two) times daily.        Allergies: No Known Allergies  Family History: Family History  Problem Relation Age of Onset   Prostate cancer Neg Hx    Kidney disease Neg Hx    Kidney cancer Neg Hx     Social History:  reports that she has never smoked. She has never used smokeless tobacco. She reports that she does not drink alcohol and does not use drugs.  ROS: For pertinent review of systems please refer to history of present illness  Physical Exam: LMP 04/20/2015 (Approximate)   Constitutional:  Well nourished. Alert and oriented, No acute distress. HEENT: [place] AT,  moist mucus membranes.  Trachea midline, no masses. Cardiovascular: No clubbing, cyanosis, or edema. Respiratory: Normal respiratory effort, no increased work of breathing. GI: Abdomen is soft, non tender, non distended, no abdominal masses. Liver and spleen not palpable.  No hernias appreciated.  Stool sample for occult testing is not indicated.   GU: No CVA tenderness.  No bladder fullness or masses.  *** external genitalia, *** pubic hair distribution, no lesions.  Normal urethral meatus, no lesions, no prolapse, no discharge.   No urethral masses, tenderness and/or tenderness. No bladder fullness, tenderness or masses. *** vagina mucosa, *** estrogen effect, no discharge, no lesions, *** pelvic support, *** cystocele and *** rectocele noted.  No cervical motion tenderness.  Uterus is freely mobile and non-fixed.  No adnexal/parametria masses or tenderness noted.  Anus and perineum are without rashes or lesions.   ***  Skin: No rashes, bruises or suspicious lesions. Lymph: No cervical or inguinal adenopathy. Neurologic: Grossly intact, no focal deficits, moving all 4 extremities. Psychiatric: Normal mood and affect.    Laboratory Data: Urinalysis ***  I have reviewed the labs.   Pertinent Imaging ***  Assessment & Plan:    1. History of hematuria Completed hematuria work up in 2017 - no worrisome findings No report of gross hematuria UA was negative for hematuria RTC in one year for UA Patient  to report any gross hematuria in the interim  2. Left punctate renal stone No intervention warranted at this time  3. Frequency Not bothersome to patient at this time  4. Nocturia Sleep apnea has been ruled out   No follow-ups on file.  These notes generated with voice recognition software. I apologize for typographical errors.  Michiel Cowboy, PA-C   [facility] [address] [place], [place] [address] [phone]

## 2020-12-24 ENCOUNTER — Ambulatory Visit: Payer: Medicaid Other | Admitting: Urology

## 2020-12-24 DIAGNOSIS — R319 Hematuria, unspecified: Secondary | ICD-10-CM

## 2020-12-24 DIAGNOSIS — N200 Calculus of kidney: Secondary | ICD-10-CM

## 2020-12-24 DIAGNOSIS — R350 Frequency of micturition: Secondary | ICD-10-CM

## 2020-12-24 DIAGNOSIS — R351 Nocturia: Secondary | ICD-10-CM

## 2020-12-28 NOTE — Telephone Encounter (Signed)
Opened in error

## 2020-12-30 NOTE — Progress Notes (Incomplete)
12/30/20 1:02 PM   Kimberly Townsend Apr 10, 1960 [id number]  Referring provider:  Dione Housekeeper, MD [address] [place],  [place] [address] No chief complaint on file.   Urological history: History of hematuria (high risk)  - Non smoker  - Underwent a CT urogram at [facility] on 11/13/2014 that revealed  a punctate calculus in the left renal superior pole.  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   - Negative cystoscopy on 01/22/2016   2. Left punctate renal stone   3. Frequency  - Managed on oxybutynin   HPI: Kimberly Townsend is a 60 y.o.female who presents today for 1 year follow-up with PVR, UA, and OAB questionnaire.   She reports today ***      PMH: Past Medical History:  Diagnosis Date   Hematuria     Surgical History: Past Surgical History:  Procedure Laterality Date   CESAREAN SECTION      Home Medications:  Allergies as of 12/31/2020   No Known Allergies      Medication List        Accurate as of December 30, 2020  1:02 PM. If you have any questions, ask your nurse or doctor.          brompheniramine-pseudoephedrine-DM 30-2-10 MG/5ML syrup Take 5 mLs by mouth 4 (four) times daily as needed.   oxybutynin 5 MG/5ML syrup Commonly known as: DITROPAN Take 5 mLs (5 mg total) by mouth 2 (two) times daily.        Allergies:  No Known Allergies  Family History: Family History  Problem Relation Age of Onset   Prostate cancer Neg Hx    Kidney disease Neg Hx    Kidney cancer Neg Hx     Social History:  reports that she has never smoked. She has never used smokeless tobacco. She reports that she does not drink alcohol and does not use drugs.   Physical Exam: LMP 04/20/2015 (Approximate)   Constitutional:  Alert and oriented, No acute distress. HEENT: [place] AT, moist mucus membranes.  Trachea midline, no masses. Cardiovascular: No  clubbing, cyanosis, or edema. Respiratory: Normal respiratory effort, no increased work of breathing. GI: Abdomen is soft, nontender, nondistended, no abdominal masses GU: No CVA tenderness Lymph: No cervical or inguinal lymphadenopathy. Skin: No rashes, bruises or suspicious lesions. Neurologic: Grossly intact, no focal deficits, moving all 4 extremities. Psychiatric: Normal mood and affect.  Laboratory Data:  Lab Results  Component Value Date   CREATININE 0.78 09/18/2015     Urinalysis   Pertinent Imaging:   Assessment & Plan:     No follow-ups on file.  [facility] [address] [place], [place] [address] [phone]  I,Kailey Littlejohn,acting as a scribe for [facility], PA-C.,have documented all relevant documentation on the behalf of SHANNON MCGOWAN, PA-C,as directed by  [facility], PA-C while in the presence of SHANNON MCGOWAN, PA-C.

## 2020-12-31 ENCOUNTER — Ambulatory Visit: Payer: Medicaid Other | Admitting: Urology

## 2021-01-06 NOTE — Progress Notes (Signed)
01/08/2019 8:20 PM   Kimberly Townsend 1960-09-20 [id number]  Referring provider: Dione Housekeeper, MD [address] [place],  [place] [address]  Chief Complaint  Patient presents with   Other    Urological history: 1. 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 Nephrolithiasis -CTU 2016 - punctate calculus in the left kidney   3. Nocturia -PCP deemed her not a candidate for a sleep study  4. OAB -contributing factors of age, vaginal atrophy and diabetes  -managed with oxybutynin IR 5 mg syrup, prn  HPI: Kimberly Townsend is a 60 y.o. female who presents today for her yearly appointment.   PMH: Past Medical History:  Diagnosis Date   Hematuria     Surgical History: Past Surgical History:  Procedure Laterality Date   CESAREAN SECTION      Home Medications:  Allergies as of 01/07/2021   No Known Allergies      Medication List        Accurate as of January 07, 2021 11:59 PM. If you have any questions, ask your nurse or doctor.          brompheniramine-pseudoephedrine-DM 30-2-10 MG/5ML syrup Take 5 mLs by mouth 4 (four) times daily as needed.   oxybutynin 5 MG/5ML syrup Commonly known as: DITROPAN Take 5 mLs (5 mg total) by mouth 2 (two) times daily.        Allergies: No Known Allergies  Family History: Family History  Problem Relation Age of Onset   Prostate cancer Neg Hx    Kidney disease Neg Hx    Kidney cancer Neg Hx     Social History:  reports that she has never smoked. She has never used smokeless tobacco. She reports that she does not drink alcohol and does not use drugs.  ROS: For pertinent review of systems please refer to history of present illness  Physical Exam: LMP 04/20/2015 (Approximate)     Laboratory Data: Urinalysis Component     Latest  Ref Rng & Units 01/07/2021  Specific Gravity, UA     1.005 - 1.030 1.025  pH, UA     5.0 - 7.5 6.0  Color, UA     Yellow Yellow  Appearance Ur     Clear Clear  Leukocytes,UA     Negative Negative  Protein,UA     Negative/Trace 1+ (A)  Glucose, UA     Negative Negative  Ketones, UA     Negative Negative  RBC, UA     Negative Trace (A)  Bilirubin, UA     Negative Negative  Urobilinogen, Ur     0.2 - 1.0 mg/dL 0.2  Nitrite, UA     Negative Negative  Microscopic Examination      See below:   Component     Latest Ref Rng & Units 01/07/2021  WBC, UA     0 - 5 /hpf 0-5  RBC     0 - 2 /hpf 0-2  Epithelial Cells (non renal)     0 - 10 /hpf 0-10  Casts     None seen /lpf Present (A)  Cast Type     N/A Hyaline casts  Bacteria, UA     None seen/Few None seen  I have reviewed the labs.   Pertinent Imaging N/A  Assessment & Plan:  1. History of hematuria  2. Left punctate renal stone  3. Frequency  4. Nocturia  Return for rescheduled .  These notes generated with voice recognition software. I apologize for typographical errors.  Michiel Cowboy, PA-C   [facility] [address] [place], [place] [address] [phone]

## 2021-01-07 ENCOUNTER — Other Ambulatory Visit: Payer: Self-pay

## 2021-01-07 ENCOUNTER — Encounter: Payer: Self-pay | Admitting: Urology

## 2021-01-07 ENCOUNTER — Ambulatory Visit: Payer: Medicaid Other | Admitting: Urology

## 2021-01-07 ENCOUNTER — Ambulatory Visit (INDEPENDENT_AMBULATORY_CARE_PROVIDER_SITE_OTHER): Payer: Self-pay | Admitting: Urology

## 2021-01-07 ENCOUNTER — Other Ambulatory Visit: Payer: Self-pay | Admitting: *Deleted

## 2021-01-07 DIAGNOSIS — R350 Frequency of micturition: Secondary | ICD-10-CM

## 2021-01-07 DIAGNOSIS — R351 Nocturia: Secondary | ICD-10-CM

## 2021-01-07 DIAGNOSIS — N200 Calculus of kidney: Secondary | ICD-10-CM

## 2021-01-08 LAB — MICROSCOPIC EXAMINATION: Bacteria, UA: NONE SEEN

## 2021-01-08 LAB — URINALYSIS, COMPLETE
Bilirubin, UA: NEGATIVE
Glucose, UA: NEGATIVE
Ketones, UA: NEGATIVE
Leukocytes,UA: NEGATIVE
Nitrite, UA: NEGATIVE
Specific Gravity, UA: 1.025 (ref 1.005–1.030)
Urobilinogen, Ur: 0.2 mg/dL (ref 0.2–1.0)
pH, UA: 6 (ref 5.0–7.5)

## 2021-01-13 NOTE — Progress Notes (Signed)
01/08/2019 6:55 PM   Kimberly Townsend 03-Mar-1961 [id number]  Referring provider: Dione Housekeeper, MD [address] [place],  [place] [address]  Chief Complaint  Patient presents with   Urinary Frequency    1year follow up    Urological history: 1. 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 Nephrolithiasis -CTU 2016 - punctate calculus in the left kidney   3. Nocturia -PCP deemed her not a candidate for a sleep study  4. OAB -contributing factors of age, vaginal atrophy and diabetes  -PVR 0 mL   HPI: Kimberly Townsend is a 61 y.o. female who presents today for her yearly appointment.  She has no urinary complaints at this time.  She is no longer taking the oxybutynin syrup.  She is experiencing 1-7 daytime urinations, 1-2 nocturia, no urgency and no incontinence.  Patient denies any modifying or aggravating factors.  Patient denies any gross hematuria, dysuria or suprapubic/flank pain.  Patient denies any fevers, chills, nausea or vomiting.     PMH: Past Medical History:  Diagnosis Date   Hematuria     Surgical History: Past Surgical History:  Procedure Laterality Date   CESAREAN SECTION      Home Medications:  Allergies as of 01/14/2021   No Known Allergies      Medication List        Accurate as of January 14, 2021 11:59 PM. If you have any questions, ask your nurse or doctor.          STOP taking these medications    brompheniramine-pseudoephedrine-DM 30-2-10 MG/5ML syrup Stopped by: Michiel Cowboy, PA-C       TAKE these medications    oxybutynin 5 MG/5ML syrup Commonly known as: DITROPAN Take 5 mLs (5 mg total) by mouth 2 (two) times daily.        Allergies: No Known Allergies  Family History: Family History  Problem Relation Age of Onset   Prostate  cancer Neg Hx    Kidney disease Neg Hx    Kidney cancer Neg Hx     Social History:  reports that she has never smoked. She has never used smokeless tobacco. She reports that she does not drink alcohol and does not use drugs.  ROS: For pertinent review of systems please refer to history of present illness  Physical Exam: BP (!) 173/89   Pulse 96   Ht 5\' 4"  (1.626 m)   Wt 156 lb (70.8 kg)   LMP 04/20/2015 (Approximate)   BMI 26.78 kg/m   Constitutional:  Well nourished. Alert and oriented, No acute distress. HEENT: [place] AT, mask in place.  Trachea midline Cardiovascular: No clubbing, cyanosis, or edema. Respiratory: Normal respiratory effort, no increased work of breathing. Neurologic: Grossly intact, no focal deficits, moving all 4 extremities. Psychiatric: Normal mood and affect.    Laboratory Data: Urinalysis Component     Latest Ref Rng & Units 01/07/2021  Specific Gravity, UA     1.005 - 1.030 1.025  pH, UA     5.0 - 7.5 6.0  Color, UA     Yellow Yellow  Appearance Ur     Clear Clear  Leukocytes,UA     Negative Negative  Protein,UA     Negative/Trace 1+ (A)  Glucose, UA     Negative Negative  Ketones, UA  Negative Negative  RBC, UA     Negative Trace (A)  Bilirubin, UA     Negative Negative  Urobilinogen, Ur     0.2 - 1.0 mg/dL 0.2  Nitrite, UA     Negative Negative  Microscopic Examination      See below:   Component     Latest Ref Rng & Units 01/07/2021  WBC, UA     0 - 5 /hpf 0-5  RBC     0 - 2 /hpf 0-2  Epithelial Cells (non renal)     0 - 10 /hpf 0-10  Casts     None seen /lpf Present (A)  Cast Type     N/A Hyaline casts  Bacteria, UA     None seen/Few None seen  I have reviewed the labs.   Pertinent Imaging Results for GARRETT, BOWRING (MRN [id number]) as of 01/14/2021 15:45  Ref. Range 01/14/2021 15:41  Scan Result Unknown 32ml    Assessment & Plan:    1. History of hematuria -Completed hematuria work up in 2017 - no worrisome  findings -No report of gross hematuria -UA was negative for hematuria -Patient to report any gross hematuria in the interim  2. Left punctate renal stone -No intervention warranted at this time  3. Frequency -Not bothersome to patient at this time -No longer taking the medication oxybutynin  4. Nocturia -Sleep apnea has been ruled out   Return in about 1 year (around 01/14/2022) for OAB questionnaire, PVR and UA .  These notes generated with voice recognition software. I apologize for typographical errors.  Michiel Cowboy, PA-C   [facility] [address] [place], [place] [address] [phone]

## 2021-01-14 ENCOUNTER — Ambulatory Visit (INDEPENDENT_AMBULATORY_CARE_PROVIDER_SITE_OTHER): Payer: 59 | Admitting: Urology

## 2021-01-14 ENCOUNTER — Encounter: Payer: Self-pay | Admitting: Urology

## 2021-01-14 ENCOUNTER — Other Ambulatory Visit: Payer: Self-pay

## 2021-01-14 ENCOUNTER — Ambulatory Visit: Payer: 59 | Admitting: Urology

## 2021-01-14 VITALS — BP 173/89 | HR 96 | Ht 64.0 in | Wt 156.0 lb

## 2021-01-14 DIAGNOSIS — R350 Frequency of micturition: Secondary | ICD-10-CM | POA: Diagnosis not present

## 2021-01-14 LAB — BLADDER SCAN AMB NON-IMAGING

## 2021-01-14 NOTE — Patient Instructions (Signed)
Gold Bond Powder

## 2021-04-14 ENCOUNTER — Other Ambulatory Visit: Payer: Self-pay

## 2022-01-13 ENCOUNTER — Ambulatory Visit: Payer: 59 | Admitting: Urology

## 2022-01-27 NOTE — Progress Notes (Deleted)
01/08/2019 9:59 PM   Kimberly Townsend 10-31-1960 [id number]  Referring provider: Dione Housekeeper, MD [address] [place],  [place] [address]  No chief complaint on file.   Urological history: 1. 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 Nephrolithiasis -CTU 2016 - punctate calculus in the left kidney   3. Nocturia -PCP deemed her not a candidate for a sleep study  4. OAB -contributing factors of age, vaginal atrophy and diabetes  -PVR 0 mL   HPI: Kimberly Townsend is a 61 y.o. female who presents today for her yearly appointment.  She has no urinary complaints at this time.  She is no longer taking the oxybutynin syrup.  She is experiencing 1-7 daytime urinations, 1-2 nocturia, no urgency and no incontinence.  Patient denies any modifying or aggravating factors.  Patient denies any gross hematuria, dysuria or suprapubic/flank pain.  Patient denies any fevers, chills, nausea or vomiting.     PMH: Past Medical History:  Diagnosis Date   Hematuria     Surgical History: Past Surgical History:  Procedure Laterality Date   CESAREAN SECTION      Home Medications:  Allergies as of 01/28/2022   No Known Allergies      Medication List        Accurate as of January 27, 2022  9:59 PM. If you have any questions, ask your nurse or doctor.          oxybutynin 5 MG/5ML syrup Commonly known as: DITROPAN Take 5 mLs (5 mg total) by mouth 2 (two) times daily.        Allergies: No Known Allergies  Family History: Family History  Problem Relation Age of Onset   Prostate cancer Neg Hx    Kidney disease Neg Hx    Kidney cancer Neg Hx     Social History:  reports that she has never smoked. She has never used smokeless tobacco. She reports that she does not drink alcohol and does not  use drugs.  ROS: For pertinent review of systems please refer to history of present illness  Physical Exam: LMP 04/20/2015 (Approximate)   Constitutional:  Well nourished. Alert and oriented, No acute distress. HEENT: [place] AT, mask in place.  Trachea midline Cardiovascular: No clubbing, cyanosis, or edema. Respiratory: Normal respiratory effort, no increased work of breathing. Neurologic: Grossly intact, no focal deficits, moving all 4 extremities. Psychiatric: Normal mood and affect.    Laboratory Data: Urinalysis Component     Latest Ref Rng & Units 01/07/2021  Specific Gravity, UA     1.005 - 1.030 1.025  pH, UA     5.0 - 7.5 6.0  Color, UA     Yellow Yellow  Appearance Ur     Clear Clear  Leukocytes,UA     Negative Negative  Protein,UA     Negative/Trace 1+ (A)  Glucose, UA     Negative Negative  Ketones, UA     Negative Negative  RBC, UA     Negative Trace (A)  Bilirubin, UA     Negative Negative  Urobilinogen, Ur     0.2 - 1.0 mg/dL 0.2  Nitrite, UA     Negative Negative  Microscopic Examination      See below:   Component     Latest Ref Rng & Units  01/07/2021  WBC, UA     0 - 5 /hpf 0-5  RBC     0 - 2 /hpf 0-2  Epithelial Cells (non renal)     0 - 10 /hpf 0-10  Casts     None seen /lpf Present (A)  Cast Type     N/A Hyaline casts  Bacteria, UA     None seen/Few None seen  I have reviewed the labs.   Pertinent Imaging Results for CHERON, CORYELL (MRN [id number]) as of 01/14/2021 15:45  Ref. Range 01/14/2021 15:41  Scan Result Unknown 12ml    Assessment & Plan:    1. History of hematuria -Completed hematuria work up in 2017 - no worrisome findings -No report of gross hematuria -UA was negative for hematuria -Patient to report any gross hematuria in the interim  2. Left punctate renal stone -No intervention warranted at this time  3. Frequency -Not bothersome to patient at this time -No longer taking the medication oxybutynin  4.  Nocturia -Sleep apnea has been ruled out   No follow-ups on file.  These notes generated with voice recognition software. I apologize for typographical errors.  Michiel Cowboy, PA-C   [facility] [address] [place], [place] [address] [phone]

## 2022-01-28 ENCOUNTER — Ambulatory Visit: Payer: 59 | Admitting: Urology

## 2022-01-28 ENCOUNTER — Encounter: Payer: Self-pay | Admitting: Urology

## 2022-02-03 IMAGING — DX DG CHEST 1V PORT
1 series · 1 of 1 positions shown · non-contrast
Comparison: None.

CLINICAL DATA: Cough and congestion.

EXAM:
PORTABLE CHEST 1 VIEW

[chest ap]
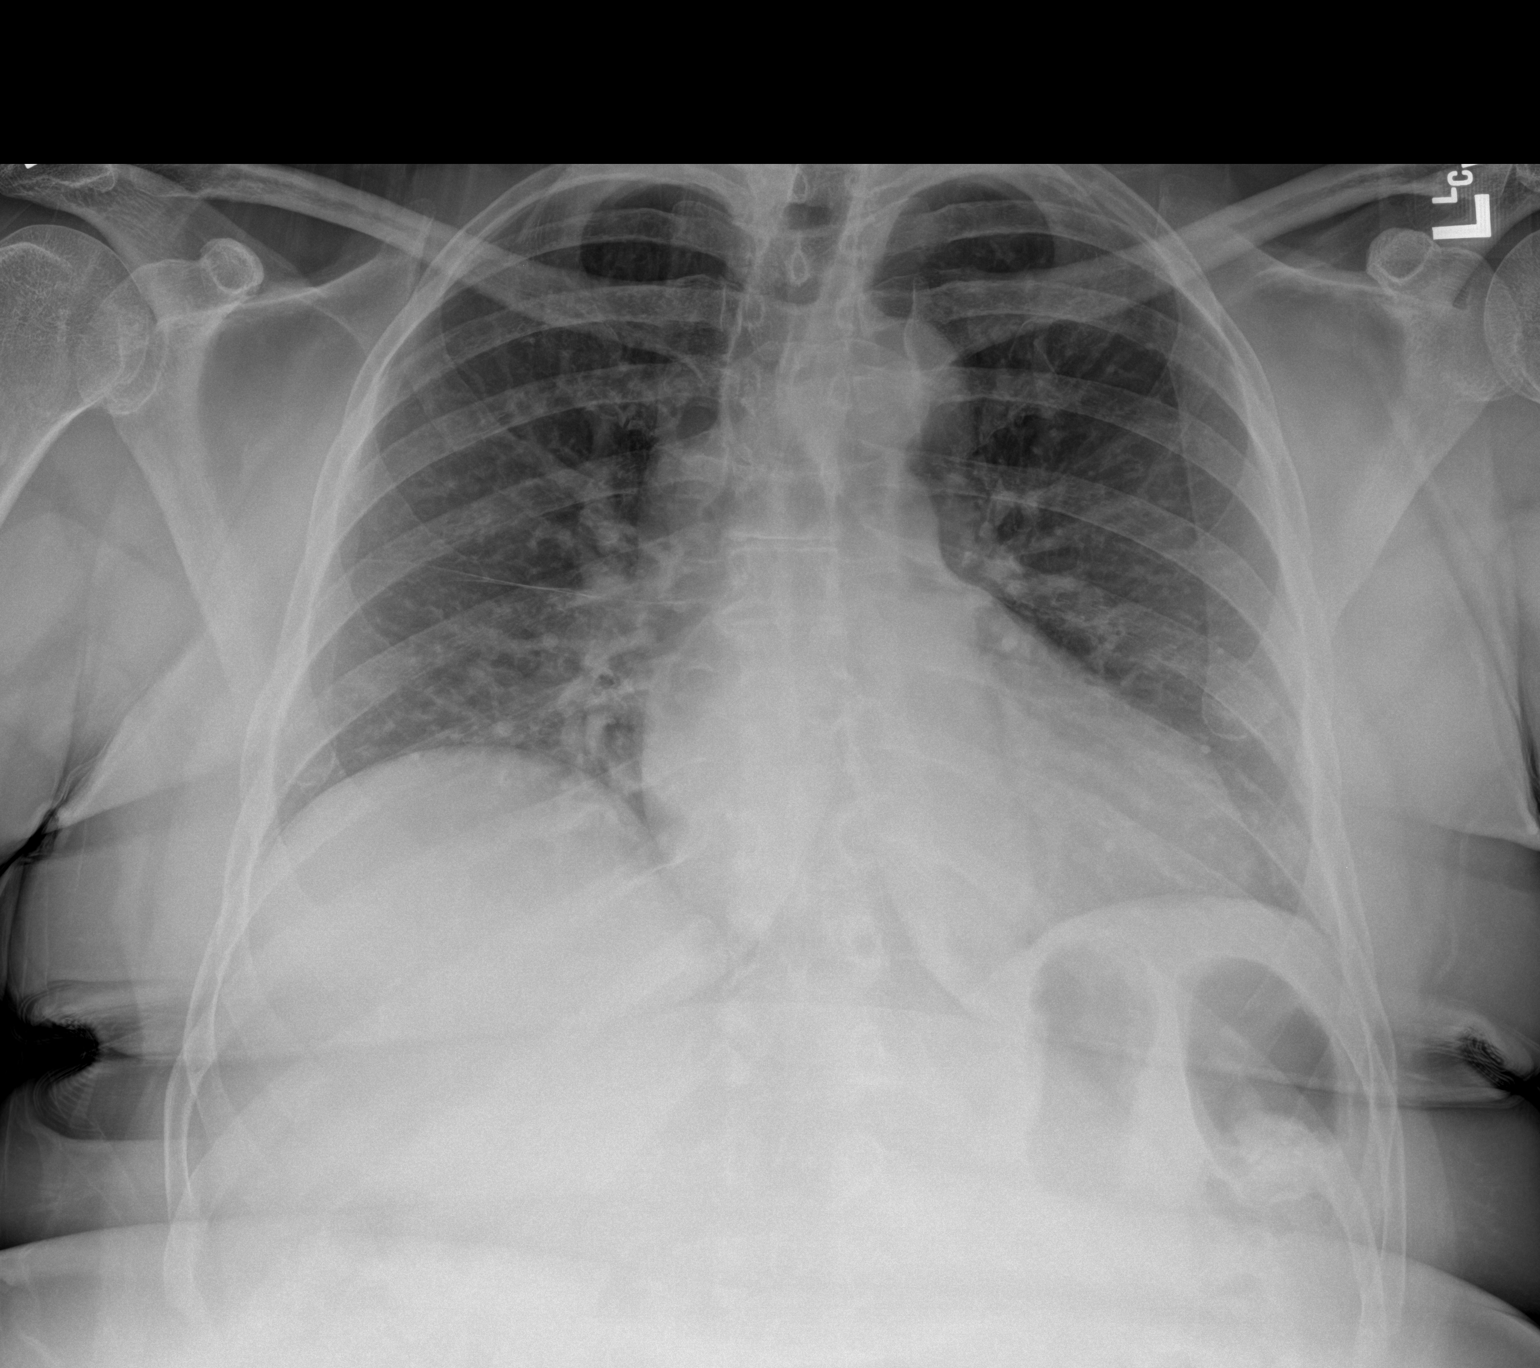

[1 of 1 positions shown; findings below may reference images not displayed]

FINDINGS: The heart, hila, mediastinum, lungs, and pleura are normal. No focal
infiltrates, nodules, or masses.
IMPRESSION: No active disease.

## 2022-03-01 NOTE — Progress Notes (Deleted)
01/08/2019 6:55 PM   Kimberly Townsend 1960-12-15 [id number]  Referring provider: Dione Housekeeper, MD [address] [place],  [place] [address]  Chief Complaint  Patient presents with   Urinary Frequency    1year follow up    Urological history: 1. 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 Nephrolithiasis -CTU 2016 - punctate calculus in the left kidney   3. Nocturia -PCP deemed her not a candidate for a sleep study  4. OAB -contributing factors of age, vaginal atrophy and diabetes  -PVR *** mL   HPI: Kimberly Townsend is a 61 y.o. female who presents today for her yearly appointment.  UA ***  PVR ***  PMH: Past Medical History:  Diagnosis Date   Hematuria     Surgical History: Past Surgical History:  Procedure Laterality Date   CESAREAN SECTION      Home Medications:  Allergies as of 01/14/2021   No Known Allergies      Medication List        Accurate as of January 14, 2021 11:59 PM. If you have any questions, ask your nurse or doctor.          STOP taking these medications    brompheniramine-pseudoephedrine-DM 30-2-10 MG/5ML syrup Stopped by: Michiel Cowboy, PA-C       TAKE these medications    oxybutynin 5 MG/5ML syrup Commonly known as: DITROPAN Take 5 mLs (5 mg total) by mouth 2 (two) times daily.        Allergies: No Known Allergies  Family History: Family History  Problem Relation Age of Onset   Prostate cancer Neg Hx    Kidney disease Neg Hx    Kidney cancer Neg Hx     Social History:  reports that she has never smoked. She has never used smokeless tobacco. She reports that she does not drink alcohol and does not use drugs.  ROS: For pertinent review of systems please refer to history of present illness  Physical  Exam: BP (!) 173/89   Pulse 96   Ht 5\' 4"  (1.626 m)   Wt 156 lb (70.8 kg)   LMP 04/20/2015 (Approximate)   BMI 26.78 kg/m   Constitutional:  Well nourished. Alert and oriented, No acute distress. HEENT: [place] AT, moist mucus membranes.  Trachea midline, no masses. Cardiovascular: No clubbing, cyanosis, or edema. Respiratory: Normal respiratory effort, no increased work of breathing. GU: No CVA tenderness.  No bladder fullness or masses. Vulvovaginal atrophy w/ pallor, loss of rugae, introital retraction, excoriations.  Vulvar thinning, fusion of labia, clitoral hood retraction, prominent urethral meatus.   *** external genitalia, *** pubic hair distribution, no lesions.  Normal urethral meatus, no lesions, no prolapse, no discharge.   No urethral masses, tenderness and/or tenderness. No bladder fullness, tenderness or masses. *** vagina mucosa, *** estrogen effect, no discharge, no lesions, *** pelvic support, *** cystocele and *** rectocele noted.  No cervical motion tenderness.  Uterus is freely mobile and non-fixed.  No adnexal/parametria masses or tenderness noted.  Anus and perineum are without rashes or lesions.   ***  Neurologic: Grossly intact, no focal deficits, moving all 4 extremities. Psychiatric: Normal mood and affect.    Laboratory Data: Serum creatinine (08/2021) 0.85  Urinalysis *** I have reviewed the labs.   Pertinent  Imaging ***   Assessment & Plan:    1. History of hematuria -Completed hematuria work up in 2017 - no worrisome findings -No report of gross hematuria -UA was negative for hematuria -Patient to report any gross hematuria in the interim  2. Left punctate renal stone -No intervention warranted at this time  3. Frequency -Not bothersome to patient at this time -No longer taking the medication oxybutynin  4. Nocturia -Sleep apnea has been ruled out   Return in about 1 year (around 01/14/2022) for OAB questionnaire, PVR and UA .  These notes  generated with voice recognition software. I apologize for typographical errors.  [place], [place] [address] [place], [place] [address] [phone]

## 2022-03-02 ENCOUNTER — Ambulatory Visit: Payer: Medicaid Other | Admitting: Urology

## 2022-03-02 DIAGNOSIS — R319 Hematuria, unspecified: Secondary | ICD-10-CM

## 2022-03-02 DIAGNOSIS — R350 Frequency of micturition: Secondary | ICD-10-CM

## 2022-03-02 DIAGNOSIS — R351 Nocturia: Secondary | ICD-10-CM

## 2022-03-02 DIAGNOSIS — N200 Calculus of kidney: Secondary | ICD-10-CM

## 2022-03-07 NOTE — Progress Notes (Unsigned)
01/08/2019 9:53 PM   Kimberly Townsend September 04, 1960 [id number]  Referring provider: Dione Housekeeper, MD [address] [place],  [place] [address]  No chief complaint on file.   Urological history: 1. 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 Nephrolithiasis -CTU 2016 - punctate calculus in the left kidney   3. Nocturia -PCP deemed her not a candidate for a sleep study  4. OAB -contributing factors of age, vaginal atrophy and diabetes  -PVR *** mL   HPI: Jessice Townsend is a 61 y.o. female who presents today for her yearly appointment.  UA ***  PVR ***  PMH: Past Medical History:  Diagnosis Date   Hematuria     Surgical History: Past Surgical History:  Procedure Laterality Date   CESAREAN SECTION      Home Medications:  Allergies as of 03/08/2022   No Known Allergies      Medication List        Accurate as of March 07, 2022  9:53 PM. If you have any questions, ask your nurse or doctor.          oxybutynin 5 MG/5ML syrup Commonly known as: DITROPAN Take 5 mLs (5 mg total) by mouth 2 (two) times daily.        Allergies: No Known Allergies  Family History: Family History  Problem Relation Age of Onset   Prostate cancer Neg Hx    Kidney disease Neg Hx    Kidney cancer Neg Hx     Social History:  reports that she has never smoked. She has never used smokeless tobacco. She reports that she does not drink alcohol and does not use drugs.  ROS: For pertinent review of systems please refer to history of present illness  Physical Exam: LMP 04/20/2015 (Approximate)   Constitutional:  Well nourished. Alert and oriented, No acute distress. HEENT: [place] AT, moist mucus membranes.  Trachea midline, no masses. Cardiovascular: No clubbing, cyanosis, or  edema. Respiratory: Normal respiratory effort, no increased work of breathing. GU: No CVA tenderness.  No bladder fullness or masses. Vulvovaginal atrophy w/ pallor, loss of rugae, introital retraction, excoriations.  Vulvar thinning, fusion of labia, clitoral hood retraction, prominent urethral meatus.   *** external genitalia, *** pubic hair distribution, no lesions.  Normal urethral meatus, no lesions, no prolapse, no discharge.   No urethral masses, tenderness and/or tenderness. No bladder fullness, tenderness or masses. *** vagina mucosa, *** estrogen effect, no discharge, no lesions, *** pelvic support, *** cystocele and *** rectocele noted.  No cervical motion tenderness.  Uterus is freely mobile and non-fixed.  No adnexal/parametria masses or tenderness noted.  Anus and perineum are without rashes or lesions.   ***  Neurologic: Grossly intact, no focal deficits, moving all 4 extremities. Psychiatric: Normal mood and affect.    Laboratory Data: Serum creatinine (08/2021) 0.85  Urinalysis *** I have reviewed the labs.   Pertinent Imaging ***   Assessment & Plan:    1. History of hematuria -Completed hematuria work up in 2017 - no worrisome findings -No report of gross hematuria -UA was negative for hematuria -Patient to report any gross hematuria in the interim  2. Left punctate renal stone -No intervention warranted at this time  3. Frequency -Not bothersome to patient at this time -No longer  taking the medication oxybutynin  4. Nocturia -Sleep apnea has been ruled out   No follow-ups on file.  These notes generated with voice recognition software. I apologize for typographical errors.  Kimberly Townsend   [facility] [address] [place], [place] [address] [phone]

## 2022-03-08 ENCOUNTER — Encounter: Payer: Self-pay | Admitting: Urology

## 2022-03-08 ENCOUNTER — Ambulatory Visit (INDEPENDENT_AMBULATORY_CARE_PROVIDER_SITE_OTHER): Payer: 59 | Admitting: Urology

## 2022-03-08 VITALS — BP 179/107 | HR 105 | Ht 63.0 in | Wt 152.0 lb

## 2022-03-08 DIAGNOSIS — N200 Calculus of kidney: Secondary | ICD-10-CM | POA: Diagnosis not present

## 2022-03-08 DIAGNOSIS — R351 Nocturia: Secondary | ICD-10-CM

## 2022-03-08 DIAGNOSIS — R350 Frequency of micturition: Secondary | ICD-10-CM | POA: Diagnosis not present

## 2022-03-08 DIAGNOSIS — R319 Hematuria, unspecified: Secondary | ICD-10-CM

## 2022-03-08 LAB — BLADDER SCAN AMB NON-IMAGING: Scan Result: 0

## 2023-03-06 NOTE — Progress Notes (Deleted)
01/08/2019 9:14 PM   Kimberly Townsend [birth year] [id number]  Referring provider: Dione Housekeeper, MD [address] [place],  [place] [address]  Urological history: 1. 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 Nephrolithiasis -CTU 2016 - punctate calculus in the left kidney   3. Nocturia -PCP deemed her not a candidate for a sleep study  4. OAB -contributing factors of age, vaginal atrophy and diabetes   HPI: Kimberly Townsend is a 62 y.o. female who presents today for her yearly appointment.  Previous records reviewed.      PVR *** mL  UA ***  PMH: Past Medical History:  Diagnosis Date   Hematuria     Surgical History: Past Surgical History:  Procedure Laterality Date   CESAREAN SECTION      Home Medications:  Allergies as of 03/09/2023   No Known Allergies      Medication List        Accurate as of March 06, 2023  9:14 PM. If you have any questions, ask your nurse or doctor.          oxybutynin 5 MG/5ML syrup Commonly known as: DITROPAN Take 5 mLs (5 mg total) by mouth 2 (two) times daily.        Allergies: No Known Allergies  Family History: Family History  Problem Relation Age of Onset   Prostate cancer Neg Hx    Kidney disease Neg Hx    Kidney cancer Neg Hx     Social History:  reports that she has never smoked. She has never used smokeless tobacco. She reports that she does not drink alcohol and does not use drugs.  ROS: For pertinent review of systems please refer to history of present illness  Physical Exam: LMP 04/20/2015 (Approximate)   Constitutional:  Well nourished. Alert and oriented, No acute distress. HEENT: [place] AT, moist mucus membranes.  Trachea midline, no masses. Cardiovascular: No clubbing, cyanosis, or edema. Respiratory: Normal respiratory effort,  no increased work of breathing. GU: No CVA tenderness.  No bladder fullness or masses.  Recession of labia minora, dry, pale vulvar vaginal mucosa and loss of mucosal ridges and folds.  Normal urethral meatus, no lesions, no prolapse, no discharge.   No urethral masses, tenderness and/or tenderness. No bladder fullness, tenderness or masses. *** vagina mucosa, *** estrogen effect, no discharge, no lesions, *** pelvic support, *** cystocele and *** rectocele noted.  No cervical motion tenderness.  Uterus is freely mobile and non-fixed.  No adnexal/parametria masses or tenderness noted.  Anus and perineum are without rashes or lesions.   ***  Neurologic: Grossly intact, no focal deficits, moving all 4 extremities. Psychiatric: Normal mood and affect.    Laboratory Data: Urinalysis: See EPIC and HPI  I have reviewed the labs.   Pertinent Imaging: N/A   Assessment & Plan:    1. History of hematuria -Completed hematuria work up in 2017 - no worrisome findings -No report of gross hematuria -UA was negative for hematuria in June 2023 -Patient to report any gross hematuria in the interim  2. Left punctate renal stone -Asymptomatic -No intervention warranted at this time  3. Frequency -Not bothersome to patient at this time  4. Nocturia -Sleep apnea has been ruled out   No follow-ups on file.  These notes generated with voice recognition software. I  apologize for typographical errors.  [name]   [facility] [address] [place], [place] [address] [phone]

## 2023-03-09 ENCOUNTER — Ambulatory Visit: Payer: 59 | Admitting: Urology

## 2023-03-09 ENCOUNTER — Ambulatory Visit: Payer: Medicaid Other | Admitting: Urology

## 2023-03-10 ENCOUNTER — Encounter: Payer: Self-pay | Admitting: Urology

## 2023-03-28 NOTE — Progress Notes (Deleted)
 01/08/2019 7:51 PM   Kimberly Townsend 08/11/1960 [id number]  Referring provider: Eliverto Bette Hover, MD [address] [place],  [place] [address]  Urological history: 1. 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 Nephrolithiasis -CTU 2016 - punctate calculus in the left kidney   3. Nocturia -PCP deemed her not a candidate for a sleep study  4. OAB -contributing factors of age, vaginal atrophy and diabetes   HPI: Kimberly Townsend is a 63 y.o. female who presents today for her yearly appointment.  Previous records reviewed.      PVR *** mL  UA ***  PMH: Past Medical History:  Diagnosis Date   Hematuria     Surgical History: Past Surgical History:  Procedure Laterality Date   CESAREAN SECTION      Home Medications:  Allergies as of 03/30/2023   No Known Allergies      Medication List        Accurate as of March 28, 2023  7:51 PM. If you have any questions, ask your nurse or doctor.          oxybutynin  5 MG/5ML syrup Commonly known as: DITROPAN  Take 5 mLs (5 mg total) by mouth 2 (two) times daily.        Allergies: No Known Allergies  Family History: Family History  Problem Relation Age of Onset   Prostate cancer Neg Hx    Kidney disease Neg Hx    Kidney cancer Neg Hx     Social History:  reports that she has never smoked. She has never used smokeless tobacco. She reports that she does not drink alcohol and does not use drugs.  ROS: For pertinent review of systems please refer to history of present illness  Physical Exam: LMP 04/20/2015 (Approximate)   Constitutional:  Well nourished. Alert and oriented, No acute distress. HEENT:  AT, moist mucus membranes.  Trachea midline, no masses. Cardiovascular: No clubbing, cyanosis, or edema. Respiratory: Normal respiratory effort, no  increased work of breathing. GU: No CVA tenderness.  No bladder fullness or masses.  Recession of labia minora, dry, pale vulvar vaginal mucosa and loss of mucosal ridges and folds.  Normal urethral meatus, no lesions, no prolapse, no discharge.   No urethral masses, tenderness and/or tenderness. No bladder fullness, tenderness or masses. *** vagina mucosa, *** estrogen effect, no discharge, no lesions, *** pelvic support, *** cystocele and *** rectocele noted.  No cervical motion tenderness.  Uterus is freely mobile and non-fixed.  No adnexal/parametria masses or tenderness noted.  Anus and perineum are without rashes or lesions.   ***  Neurologic: Grossly intact, no focal deficits, moving all 4 extremities. Psychiatric: Normal mood and affect.    Laboratory Data: Urinalysis: See EPIC and HPI  I have reviewed the labs.   Pertinent Imaging: N/A   Assessment & Plan:    1. High risk hematuria -Completed hematuria work up in 2017 - no worrisome findings -No report of gross hematuria -UA was negative for hematuria in June 2023 -Patient to report any gross hematuria in the interim  2. Left punctate renal stone -Asymptomatic -No intervention warranted at this time  3. Frequency -Not bothersome to patient at this time  4. Nocturia -Sleep apnea has been ruled out   No follow-ups on file.  These notes generated with voice recognition software. I  apologize for typographical errors.  Kimberly Townsend   [facility] [address] [place], [place] [address] [phone]

## 2023-03-30 ENCOUNTER — Ambulatory Visit: Payer: Medicaid Other | Admitting: Urology

## 2023-03-30 DIAGNOSIS — R319 Hematuria, unspecified: Secondary | ICD-10-CM

## 2023-03-30 DIAGNOSIS — R351 Nocturia: Secondary | ICD-10-CM

## 2023-03-30 DIAGNOSIS — N200 Calculus of kidney: Secondary | ICD-10-CM

## 2023-03-30 DIAGNOSIS — R350 Frequency of micturition: Secondary | ICD-10-CM

## 2023-04-10 NOTE — Progress Notes (Deleted)
01/08/2019 3:31 PM   Kimberly Townsend Jul 23, 1960 [id number]  Referring provider: Dione Housekeeper, MD [address] [place],  [place] [address]  Urological history: 1. 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 Nephrolithiasis -CTU 2016 - punctate calculus in the left kidney   3. Nocturia -PCP deemed her not a candidate for a sleep study  4. OAB -contributing factors of age, vaginal atrophy and diabetes   HPI: Kimberly Townsend is a 63 y.o. female who presents today for her yearly appointment.  Previous records reviewed.      PVR *** mL  UA ***  PMH: Past Medical History:  Diagnosis Date   Hematuria     Surgical History: Past Surgical History:  Procedure Laterality Date   CESAREAN SECTION      Home Medications:  Allergies as of 04/12/2023   No Known Allergies      Medication List        Accurate as of April 10, 2023  3:31 PM. If you have any questions, ask your nurse or doctor.          oxybutynin 5 MG/5ML syrup Commonly known as: DITROPAN Take 5 mLs (5 mg total) by mouth 2 (two) times daily.        Allergies: No Known Allergies  Family History: Family History  Problem Relation Age of Onset   Prostate cancer Neg Hx    Kidney disease Neg Hx    Kidney cancer Neg Hx     Social History:  reports that she has never smoked. She has never used smokeless tobacco. She reports that she does not drink alcohol and does not use drugs.  ROS: For pertinent review of systems please refer to history of present illness  Physical Exam: LMP 04/20/2015 (Approximate)   Constitutional:  Well nourished. Alert and oriented, No acute distress. HEENT: [place] AT, moist mucus membranes.  Trachea midline, no masses. Cardiovascular: No clubbing, cyanosis, or edema. Respiratory: Normal respiratory effort, no  increased work of breathing. GU: No CVA tenderness.  No bladder fullness or masses.  Recession of labia minora, dry, pale vulvar vaginal mucosa and loss of mucosal ridges and folds.  Normal urethral meatus, no lesions, no prolapse, no discharge.   No urethral masses, tenderness and/or tenderness. No bladder fullness, tenderness or masses. *** vagina mucosa, *** estrogen effect, no discharge, no lesions, *** pelvic support, *** cystocele and *** rectocele noted.  No cervical motion tenderness.  Uterus is freely mobile and non-fixed.  No adnexal/parametria masses or tenderness noted.  Anus and perineum are without rashes or lesions.   ***  Neurologic: Grossly intact, no focal deficits, moving all 4 extremities. Psychiatric: Normal mood and affect.    Laboratory Data: Urinalysis: See EPIC and HPI  I have reviewed the labs.   Pertinent Imaging: N/A   Assessment & Plan:    1. High risk hematuria -Completed hematuria work up in 2017 - no worrisome findings -No report of gross hematuria -UA was negative for hematuria in June 2023 -Patient to report any gross hematuria in the interim  2. Left punctate renal stone -Asymptomatic -No intervention warranted at this time  3. Frequency -Not bothersome to patient at this time  4. Nocturia -Sleep apnea has been ruled out   No follow-ups on file.  These notes generated with voice recognition software. I  apologize for typographical errors.  [name]   [facility] [address] [place], [place] [address] [phone]

## 2023-04-11 ENCOUNTER — Ambulatory Visit: Payer: Medicaid Other | Admitting: Urology

## 2023-04-12 ENCOUNTER — Ambulatory Visit: Payer: Medicaid Other | Admitting: Urology

## 2023-04-18 NOTE — Progress Notes (Deleted)
 01/08/2019 4:51 PM   Kimberly Townsend 05-31-1960 [id number]  Referring provider: Dione Housekeeper, MD [address] [place],  [place] [address]  Urological history: 1. 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 Nephrolithiasis -CTU 2016 - punctate calculus in the left kidney   3. Nocturia -PCP deemed her not a candidate for a sleep study  4. OAB -contributing factors of age, vaginal atrophy and diabetes   HPI: Kimberly Townsend is a 63 y.o. female who presents today for her yearly appointment.  Previous records reviewed.      PVR *** mL  UA ***  PMH: Past Medical History:  Diagnosis Date   Hematuria     Surgical History: Past Surgical History:  Procedure Laterality Date   CESAREAN SECTION      Home Medications:  Allergies as of 04/20/2023   No Known Allergies      Medication List        Accurate as of April 18, 2023  4:51 PM. If you have any questions, ask your nurse or doctor.          oxybutynin 5 MG/5ML syrup Commonly known as: DITROPAN Take 5 mLs (5 mg total) by mouth 2 (two) times daily.        Allergies: No Known Allergies  Family History: Family History  Problem Relation Age of Onset   Prostate cancer Neg Hx    Kidney disease Neg Hx    Kidney cancer Neg Hx     Social History:  reports that she has never smoked. She has never used smokeless tobacco. She reports that she does not drink alcohol and does not use drugs.  ROS: For pertinent review of systems please refer to history of present illness  Physical Exam: LMP 04/20/2015 (Approximate)   Constitutional:  Well nourished. Alert and oriented, No acute distress. HEENT: [place] AT, moist mucus membranes.  Trachea midline, no masses. Cardiovascular: No clubbing, cyanosis, or edema. Respiratory: Normal respiratory effort, no  increased work of breathing. GU: No CVA tenderness.  No bladder fullness or masses.  Recession of labia minora, dry, pale vulvar vaginal mucosa and loss of mucosal ridges and folds.  Normal urethral meatus, no lesions, no prolapse, no discharge.   No urethral masses, tenderness and/or tenderness. No bladder fullness, tenderness or masses. *** vagina mucosa, *** estrogen effect, no discharge, no lesions, *** pelvic support, *** cystocele and *** rectocele noted.  No cervical motion tenderness.  Uterus is freely mobile and non-fixed.  No adnexal/parametria masses or tenderness noted.  Anus and perineum are without rashes or lesions.   ***  Neurologic: Grossly intact, no focal deficits, moving all 4 extremities. Psychiatric: Normal mood and affect.    Laboratory Data: Urinalysis: See EPIC and HPI  I have reviewed the labs.   Pertinent Imaging: N/A   Assessment & Plan:    1. High risk hematuria -Completed hematuria work up in 2017 - no worrisome findings -No report of gross hematuria -UA was negative for hematuria in June 2023 -Patient to report any gross hematuria in the interim  2. Left punctate renal stone -Asymptomatic -No intervention warranted at this time  3. Frequency -Not bothersome to patient at this time  4. Nocturia -Sleep apnea has been ruled out   No follow-ups on file.  These notes generated with voice recognition software. I  apologize for typographical errors.  [name]   [facility] [address] [place], [place] [address] [phone]

## 2023-04-20 ENCOUNTER — Ambulatory Visit: Payer: Medicaid Other | Admitting: Urology

## 2023-04-26 NOTE — Progress Notes (Signed)
 01/08/2019 11:19 AM   Kimberly Townsend 03-13-1961 [id number]  Referring provider: Eliverto Bette Hover, MD [address] [place],  [place] [address]  Urological history: 1. 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 Nephrolithiasis -CTU 2016 - punctate calculus in the left kidney   3. Nocturia -PCP deemed her not a candidate for a sleep study  4. OAB -contributing factors of age, vaginal atrophy and diabetes   HPI: Kimberly Townsend is a 63 y.o. female who presents today for her yearly appointment.  Previous records reviewed.     She started experiencing dysuria yesterday.   She is having 1 to 7 daytime voids, 3 or more episodes of nocturia and no urge to void.  She does not have urinary leakage.  She does not limit her fluid intake.  She does engage in toilet mapping.    Patient denies any modifying or aggravating factors.  Patient denies any recent UTI's, gross hematuria or suprapubic/flank pain.  Patient denies any fevers, chills, nausea or vomiting.    UA yellow cloudy, specific gravity 1.020, 1+ heme, pH 5.5, 2+ protein, leukocytes 1+, > 30 WBC's, 3-10 RBC's, 0-10 epithelial cells, hyaline casts present, mucus threads present and many bacteria.    PMH: Past Medical History:  Diagnosis Date   Hematuria     Surgical History: Past Surgical History:  Procedure Laterality Date   CESAREAN SECTION      Home Medications:  Allergies as of 04/28/2023   No Known Allergies      Medication List        Accurate as of April 28, 2023 11:19 AM. If you have any questions, ask your nurse or doctor.          STOP taking these medications    oxybutynin  5 MG/5ML syrup Commonly known as: DITROPAN        TAKE these medications    amLODipine 5 MG tablet Commonly known as: NORVASC Take 1 tablet by mouth daily.    sertraline 100 MG tablet Commonly known as: ZOLOFT Take 150 mg by mouth daily.        Allergies: No Known Allergies  Family History: Family History  Problem Relation Age of Onset   Prostate cancer Neg Hx    Kidney disease Neg Hx    Kidney cancer Neg Hx     Social History:  reports that she has never smoked. She has never used smokeless tobacco. She reports that she does not drink alcohol and does not use drugs.  ROS: For pertinent review of systems please refer to history of present illness  Physical Exam: BP (!) 159/91   Pulse 99   Ht 5' 4 (1.626 m)   Wt 152 lb (68.9 kg)   LMP 04/20/2015 (Approximate)   BMI 26.09 kg/m   Constitutional:  Well nourished. Alert and oriented, No acute distress. HEENT: [place] AT, moist mucus membranes.  Trachea midline, no masses. Cardiovascular: No clubbing, cyanosis, or edema. Respiratory: Normal respiratory effort, no increased work of breathing. Neurologic: Grossly intact, no focal deficits, moving all 4 extremities. Psychiatric: Normal mood and affect.    Laboratory Data: Urinalysis: See EPIC and HPI  I have reviewed the labs.   Pertinent Imaging: N/A   Assessment & Plan:    1. High risk hematuria -Completed hematuria work up in 2017 - no worrisome findings -  No report of gross hematuria -UA was positive for infection, but she is experiencing dysuria -If urine culture is negative we will need to pursue a hematuria work up with CT urogram and cystoscopy  2. Dysuria -UA grossly infected  -Urine culture pending -Started empirically on Macrobid  100 mg BID x 7 days, will adjust if necessary once urine culture and sensitivity results are available    3. Left punctate renal stone -Asymptomatic -No intervention warranted at this time  4. Frequency -Not bothersome to patient at this time  5. Nocturia -Sleep apnea has been ruled out   No follow-ups on file.  These notes generated with voice recognition software. I  apologize for typographical errors.  Kimberly Townsend   [facility] [address] [place], [place] [address] [phone]

## 2023-04-28 ENCOUNTER — Ambulatory Visit (INDEPENDENT_AMBULATORY_CARE_PROVIDER_SITE_OTHER): Payer: 59 | Admitting: Urology

## 2023-04-28 VITALS — BP 159/91 | HR 99 | Ht 64.0 in | Wt 138.0 lb

## 2023-04-28 DIAGNOSIS — R351 Nocturia: Secondary | ICD-10-CM | POA: Diagnosis not present

## 2023-04-28 DIAGNOSIS — R300 Dysuria: Secondary | ICD-10-CM

## 2023-04-28 DIAGNOSIS — Z87898 Personal history of other specified conditions: Secondary | ICD-10-CM

## 2023-04-28 DIAGNOSIS — R3129 Other microscopic hematuria: Secondary | ICD-10-CM

## 2023-04-28 DIAGNOSIS — R350 Frequency of micturition: Secondary | ICD-10-CM | POA: Diagnosis not present

## 2023-04-28 LAB — MICROSCOPIC EXAMINATION: WBC, UA: >30 /[HPF] — AB (ref 0–5)

## 2023-04-28 LAB — URINALYSIS, COMPLETE
Bilirubin, UA: NEGATIVE
Glucose, UA: NEGATIVE
Ketones, UA: NEGATIVE
Nitrite, UA: NEGATIVE
Specific Gravity, UA: 1.02 (ref 1.005–1.030)
Urobilinogen, Ur: 0.2 mg/dL (ref 0.2–1.0)
pH, UA: 5.5 (ref 5.0–7.5)

## 2023-04-28 MED ORDER — NITROFURANTOIN MONOHYD MACRO 100 MG PO CAPS: 100.0000 mg | ORAL_CAPSULE | Freq: Two times a day (BID) | ORAL | 0 refills | Status: AC

## 2023-04-30 ENCOUNTER — Encounter: Payer: Self-pay | Admitting: Urology

## 2023-05-02 LAB — CULTURE, URINE COMPREHENSIVE

## 2023-05-26 ENCOUNTER — Ambulatory Visit: Payer: 59 | Admitting: Urology

## 2023-06-22 NOTE — Progress Notes (Deleted)
 01/08/2019 11:29 AM   Kimberly Townsend 1961/01/10 [id number]  Referring provider: Dione Housekeeper, MD [address] [place],  [place] [address]  Urological history: 1. 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 Nephrolithiasis -CTU 2016 - punctate calculus in the left kidney   3. Nocturia -PCP deemed her not a candidate for a sleep study  4. OAB -contributing factors of age, vaginal atrophy and diabetes   HPI: Kimberly Townsend is a 63 y.o. female who presents today for her yearly appointment.  Previous records reviewed.     At her visit on 04/28/2023, she started experiencing dysuria yesterday.   She is having 1 to 7 daytime voids, 3 or more episodes of nocturia and no urge to void.  She does not have urinary leakage.  She does not limit her fluid intake.  She does engage in toilet mapping.   Patient denies any modifying or aggravating factors.  Patient denies any recent UTI's, gross hematuria or suprapubic/flank pain.  Patient denies any fevers, chills, nausea or vomiting.  UA yellow cloudy, specific gravity 1.020, 1+ heme, pH 5.5, 2+ protein, leukocytes 1+, > 30 WBC's, 3-10 RBC's, 0-10 epithelial cells, hyaline casts present, mucus threads present and many bacteria.   Urine cultures positive for E. coli.  UA ****  PMH: Past Medical History:  Diagnosis Date   Hematuria     Surgical History: Past Surgical History:  Procedure Laterality Date   CESAREAN SECTION      Home Medications:  Allergies as of 06/23/2023   No Known Allergies      Medication List        Accurate as of June 22, 2023 11:29 AM. If you have any questions, ask your nurse or doctor.          amLODipine 5 MG tablet Commonly known as: NORVASC Take 1 tablet by mouth daily.   nitrofurantoin (macrocrystal-monohydrate) 100 MG  capsule Commonly known as: MACROBID Take 1 capsule (100 mg total) by mouth every 12 (twelve) hours.   sertraline 100 MG tablet Commonly known as: ZOLOFT Take 150 mg by mouth daily.        Allergies: No Known Allergies  Family History: Family History  Problem Relation Age of Onset   Prostate cancer Neg Hx    Kidney disease Neg Hx    Kidney cancer Neg Hx     Social History:  reports that she has never smoked. She has never used smokeless tobacco. She reports that she does not drink alcohol and does not use drugs.  ROS: For pertinent review of systems please refer to history of present illness  Physical Exam: LMP 04/20/2015 (Approximate)   Constitutional:  Well nourished. Alert and oriented, No acute distress. HEENT: [place] AT, moist mucus membranes.  Trachea midline, no masses. Cardiovascular: No clubbing, cyanosis, or edema. Respiratory: Normal respiratory effort, no increased work of breathing. GU: No CVA tenderness.  No bladder fullness or masses.  Recession of labia minora, dry, pale vulvar vaginal mucosa and loss of mucosal ridges and folds.  Normal urethral meatus, no lesions, no prolapse, no discharge.   No urethral masses, tenderness and/or tenderness. No bladder fullness, tenderness or masses. *** vagina mucosa, *** estrogen effect, no discharge, no lesions, *** pelvic support, *** cystocele and *** rectocele noted.  No cervical motion tenderness.  Uterus is freely  mobile and non-fixed.  No adnexal/parametria masses or tenderness noted.  Anus and perineum are without rashes or lesions.   ***  Neurologic: Grossly intact, no focal deficits, moving all 4 extremities. Psychiatric: Normal mood and affect.    Laboratory Data: Urinalysis: See EPIC and HPI  I have reviewed the labs.   Pertinent Imaging: N/A   Assessment & Plan:    1. High risk hematuria -Completed hematuria work up in 2017 - no worrisome findings -No report of gross hematuria -UA was positive for  infection, but she is experiencing dysuria -If urine culture is negative we will need to pursue a hematuria work up with CT urogram and cystoscopy  2. Dysuria -UA grossly infected  -Urine culture pending -Started empirically on Macrobid 100 mg BID x 7 days, will adjust if necessary once urine culture and sensitivity results are available    3. Left punctate renal stone -Asymptomatic -No intervention warranted at this time  4. Frequency -Not bothersome to patient at this time  5. Nocturia -Sleep apnea has been ruled out   No follow-ups on file.  These notes generated with voice recognition software. I apologize for typographical errors.  [name]   [facility] [address] [place], [place] [address] [phone]

## 2023-06-23 ENCOUNTER — Ambulatory Visit: Payer: 59 | Admitting: Urology

## 2023-06-23 DIAGNOSIS — R350 Frequency of micturition: Secondary | ICD-10-CM

## 2023-06-23 DIAGNOSIS — N200 Calculus of kidney: Secondary | ICD-10-CM

## 2023-06-23 DIAGNOSIS — R319 Hematuria, unspecified: Secondary | ICD-10-CM

## 2023-06-23 DIAGNOSIS — R300 Dysuria: Secondary | ICD-10-CM

## 2023-06-23 DIAGNOSIS — R351 Nocturia: Secondary | ICD-10-CM

## 2023-06-25 ENCOUNTER — Telehealth: Payer: Self-pay | Admitting: Urology

## 2023-06-25 NOTE — Telephone Encounter (Signed)
 Please call Kimberly Townsend and reschedule her missed appointment from Friday.  It is important for her to follow up to make sure the microscopic hematuria resolves.

## 2023-06-26 NOTE — Telephone Encounter (Signed)
LMOM for pt to call office to r/s appt 

## 2023-07-27 ENCOUNTER — Ambulatory Visit: Admitting: Urology

## 2023-11-08 NOTE — Progress Notes (Unsigned)
 11/09/2023 4:53 PM   Kimberly Townsend 1960-04-09 [id number]  Referring provider: Eliverto Bette Hover, MD [address] [place],  [place] [address]  Urological history: 1. 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 Nephrolithiasis -CTU 2016 - punctate calculus in the left kidney    3. Nocturia -PCP deemed her not a candidate for a sleep study   4. OAB -contributing factors of age, vaginal atrophy and diabetes   Chief Complaint  Patient presents with   Follow-up   HPI: Kimberly Townsend is a 63 y.o. woman who presents today for   Previous records reviewed.   At her visit on 04/28/2023, she started experiencing dysuria yesterday.   She is having 1 to 7 daytime voids, 3 or more episodes of nocturia and no urge to void.  She does not have urinary leakage.  She does not limit her fluid intake.  She does engage in toilet mapping.   Patient denies any modifying or aggravating factors.  Patient denies any recent UTI's, gross hematuria or suprapubic/flank pain.  Patient denies any fevers, chills, nausea or vomiting.  UA yellow cloudy, specific gravity 1.020, 1+ heme, pH 5.5, 2+ protein, leukocytes 1+, > 30 WBC's, 3-10 RBC's, 0-10 epithelial cells, hyaline casts present, mucus threads present and many bacteria.   Urine cultures positive for E. Coli.  She has no UTI symptoms today.  Patient denies any modifying or aggravating factors.  Patient denies any recent UTI's, gross hematuria, dysuria or suprapubic/flank pain.  Patient denies any fevers, chills, nausea or vomiting.     UA bland.    She continues to be quite upset about the death of her son and she focuses most of the visit on this.   PMH: Past Medical History:  Diagnosis Date   Hematuria     Surgical History: Past Surgical History:  Procedure Laterality Date   CESAREAN SECTION      Home Medications:   Allergies as of 11/09/2023   No Known Allergies      Medication List        Accurate as of November 09, 2023  4:53 PM. If you have any questions, ask your nurse or doctor.          amLODipine 5 MG tablet Commonly known as: NORVASC Take 1 tablet by mouth daily.   nitrofurantoin  (macrocrystal-monohydrate) 100 MG capsule Commonly known as: MACROBID  Take 1 capsule (100 mg total) by mouth every 12 (twelve) hours.   sertraline 100 MG tablet Commonly known as: ZOLOFT Take 150 mg by mouth daily.   spironolactone 25 MG tablet Commonly known as: ALDACTONE Take 25 mg by mouth at bedtime.        Allergies: No Known Allergies  Family History: Family History  Problem Relation Age of Onset   Prostate cancer Neg Hx    Kidney disease Neg Hx    Kidney cancer Neg Hx     Social History: See HPI for pertinent social history  ROS: Pertinent ROS in HPI  Physical Exam: BP 131/83   Pulse (!) 109   Ht 5' 4 (1.626 m)   Wt 136 lb (61.7 kg)   LMP 04/20/2015 (Approximate)   BMI 23.34 kg/m   Constitutional:  Well nourished. Alert and oriented, No acute distress. HEENT: [place] AT, moist mucus membranes.  Trachea midline Cardiovascular: No clubbing, cyanosis, or edema. Respiratory: Normal respiratory effort, no  increased work of breathing. Neurologic: Grossly intact, no focal deficits, moving all 4 extremities. Psychiatric: Normal mood and affect.    Laboratory Data: See EPIC and HPI  I have reviewed the labs.   Pertinent Imaging: N/A  Assessment & Plan:    1. High risk hematuria -Completed hematuria work up in 2017 - no worrisome findings -No report of gross hematuria -UA negative for micro heme   2. Dysuria -resolved   3. Left punctate renal stone -Asymptomatic -No intervention warranted at this time   4. Frequency -Not bothersome to patient at this time   5. Nocturia -Sleep apnea has been ruled out   Return in about 1 year (around 11/08/2024) for repeat UA  and symptom recheck .  These notes generated with voice recognition software. I apologize for typographical errors.  [name]  [facility] [address]  [address] [place], [place] [address] [phone]

## 2023-11-09 ENCOUNTER — Encounter: Payer: Self-pay | Admitting: Urology

## 2023-11-09 ENCOUNTER — Ambulatory Visit (INDEPENDENT_AMBULATORY_CARE_PROVIDER_SITE_OTHER): Admitting: Urology

## 2023-11-09 VITALS — BP 131/83 | HR 109 | Ht 64.0 in | Wt 136.0 lb

## 2023-11-09 DIAGNOSIS — R319 Hematuria, unspecified: Secondary | ICD-10-CM

## 2023-11-10 LAB — URINALYSIS, COMPLETE
Bilirubin, UA: NEGATIVE
Glucose, UA: NEGATIVE
Leukocytes,UA: NEGATIVE
Nitrite, UA: NEGATIVE
Protein,UA: NEGATIVE
Specific Gravity, UA: 1.02 (ref 1.005–1.030)
Urobilinogen, Ur: 1 mg/dL (ref 0.2–1.0)
pH, UA: 6 (ref 5.0–7.5)

## 2023-11-10 LAB — MICROSCOPIC EXAMINATION: Bacteria, UA: NONE SEEN

## 2024-11-12 ENCOUNTER — Ambulatory Visit: Admitting: Urology
